# Patient Record
Sex: Male | Born: 2000 | Race: White | Hispanic: No | Marital: Single | Smoking: Former smoker
Health system: Southern US, Community
[De-identification: ages and names within clinical notes are randomized; demographics above are authoritative.]

## PROBLEM LIST (undated history)

## (undated) DIAGNOSIS — F429 Obsessive-compulsive disorder, unspecified: Secondary | ICD-10-CM

## (undated) DIAGNOSIS — R569 Unspecified convulsions: Secondary | ICD-10-CM

## (undated) DIAGNOSIS — F909 Attention-deficit hyperactivity disorder, unspecified type: Secondary | ICD-10-CM

## (undated) DIAGNOSIS — E669 Obesity, unspecified: Secondary | ICD-10-CM

## (undated) DIAGNOSIS — F913 Oppositional defiant disorder: Secondary | ICD-10-CM

## (undated) DIAGNOSIS — F419 Anxiety disorder, unspecified: Secondary | ICD-10-CM

## (undated) DIAGNOSIS — T7840XA Allergy, unspecified, initial encounter: Secondary | ICD-10-CM

## (undated) HISTORY — PX: HYPOSPADIAS CORRECTION: SHX483

---
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
## 2022-06-04 IMAGING — CT CT HEAD W/O CM
3 of 4 series · 13 of 47 positions shown, 15 images · non-contrast
Comparison: 01/05/2012

CLINICAL DATA: Headache.  Hit head on door 3 days ago.



[Series 3: head without · axial · non-contrast · 0.45mm/px · z∈[-128,+2]mm · 7 of 36 slices shown, 9 images]
[im 5/36  brain]
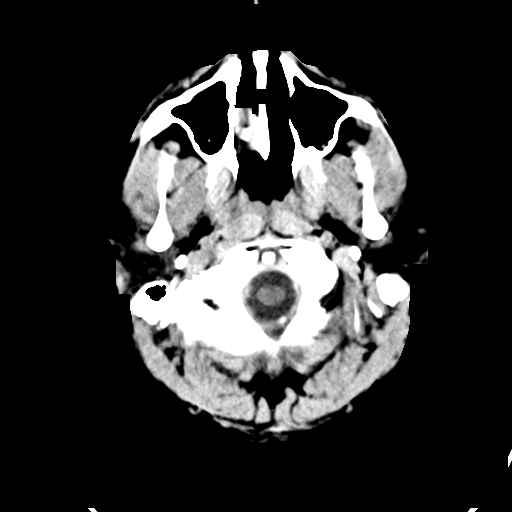
[im 5/36  bone]
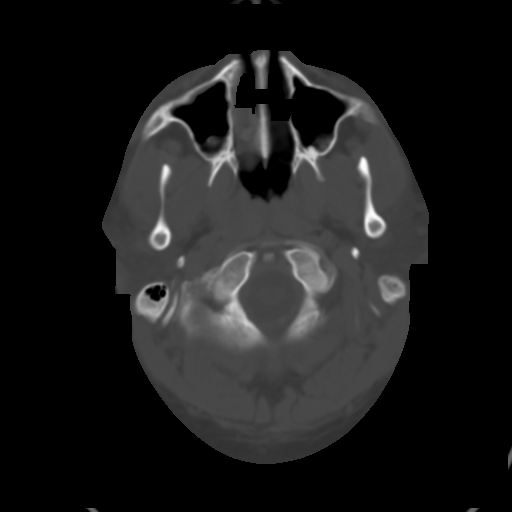
[im 9/36  brain]
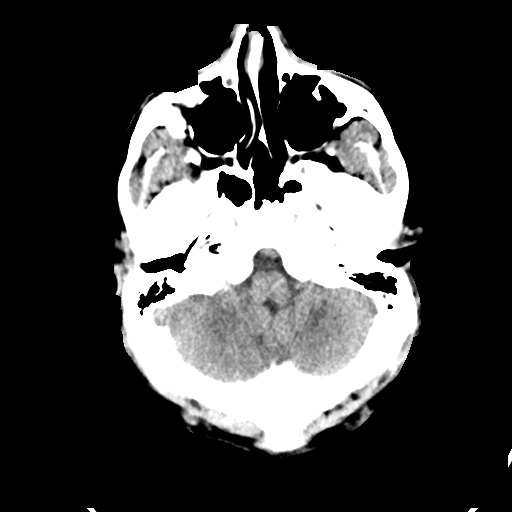
[im 14/36  brain]
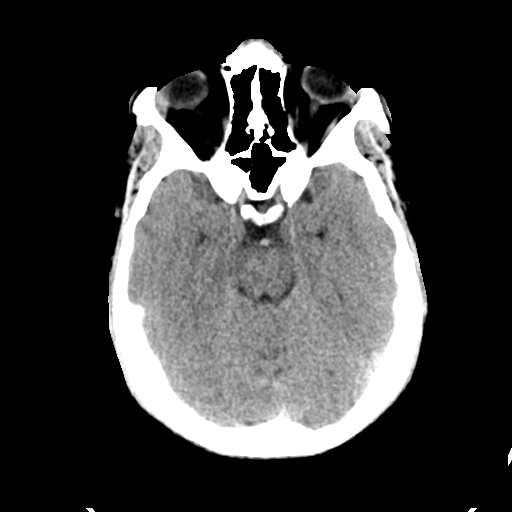
[im 18/36  brain]
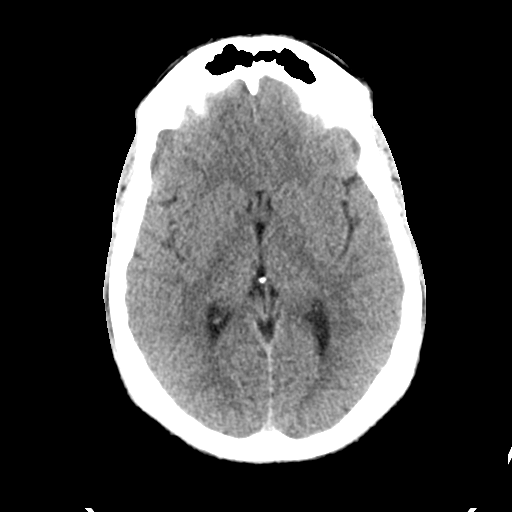
[im 22/36  brain]
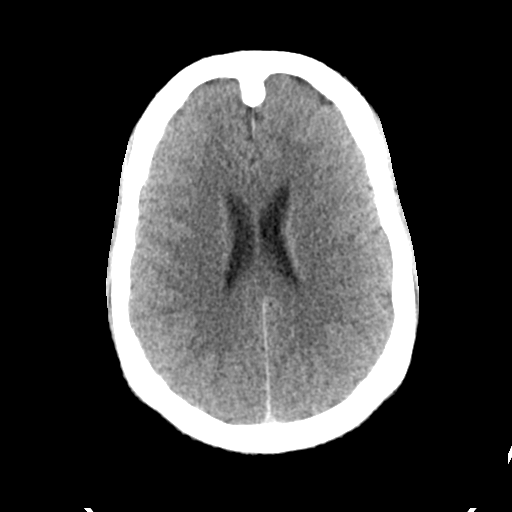
[im 22/36  bone]
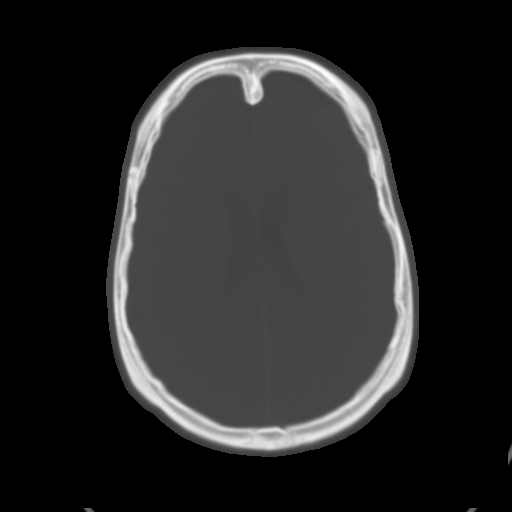
[im 27/36  brain]
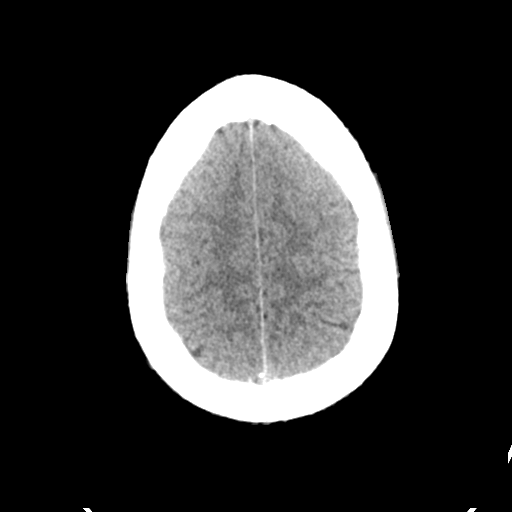
[im 31/36  brain]
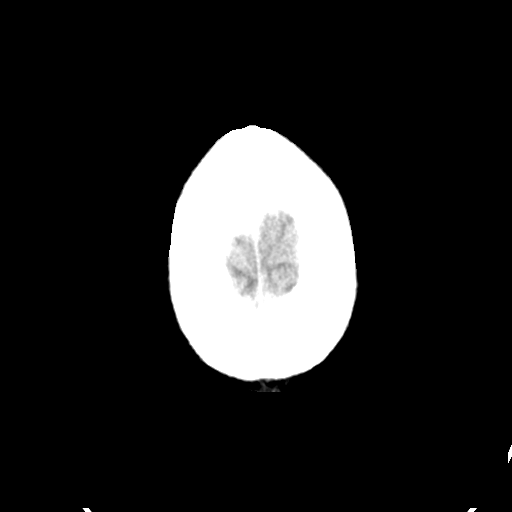

[Series 5: head without cor · coronal · non-contrast · 0.34mm/px · 3 of 77 slices shown]
[im 26/77  brain]
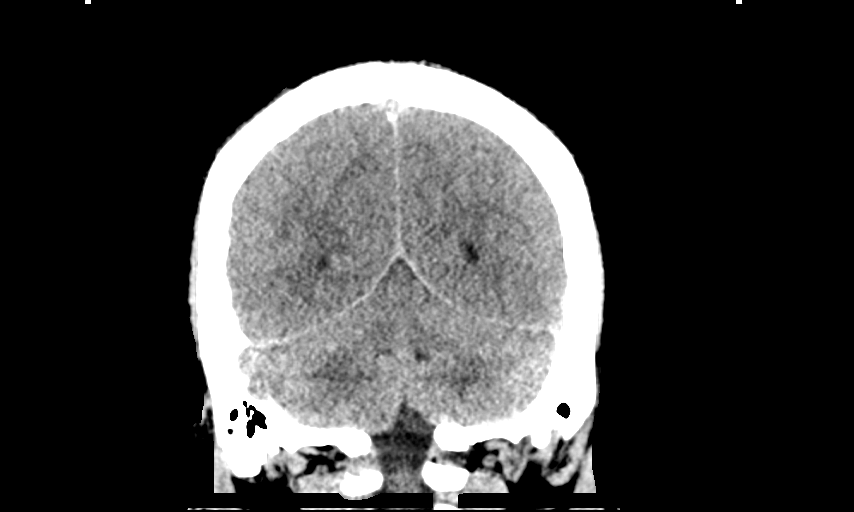
[im 34/77  brain]
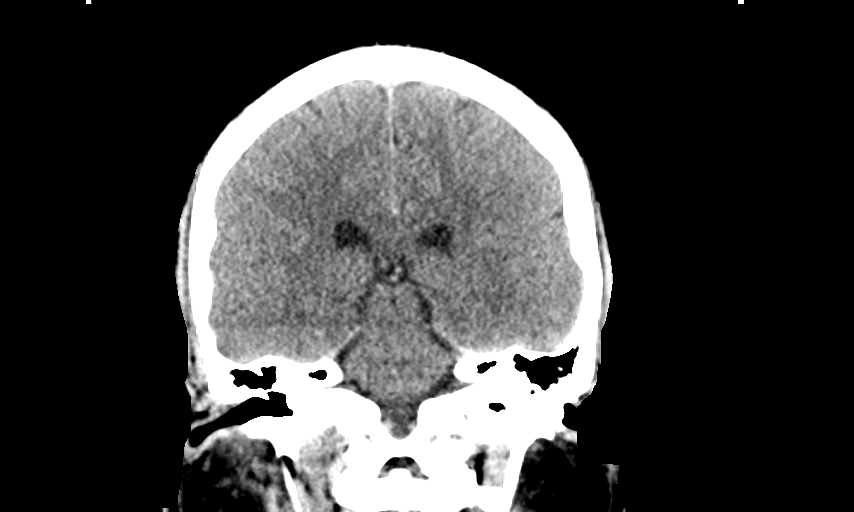
[im 43/77  brain]
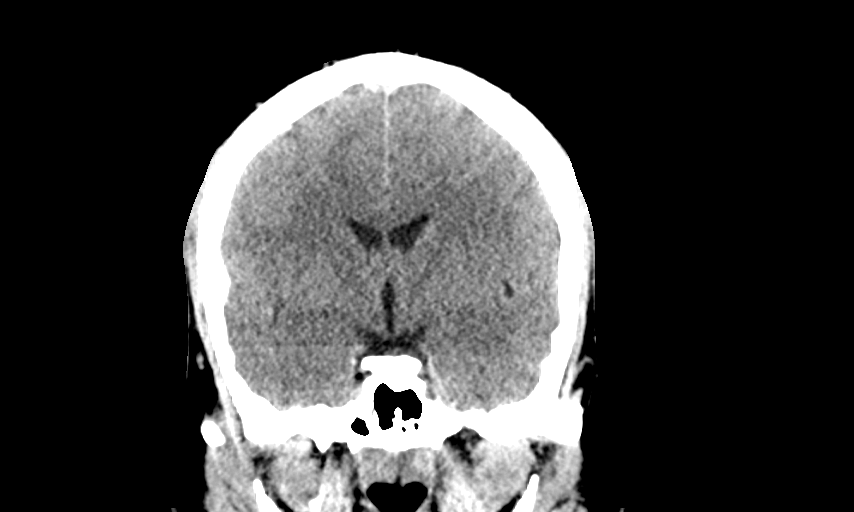

[Series 6: head without sag · sagittal · non-contrast · 0.34mm/px · 3 of 67 slices shown]
[im 23/67  brain]
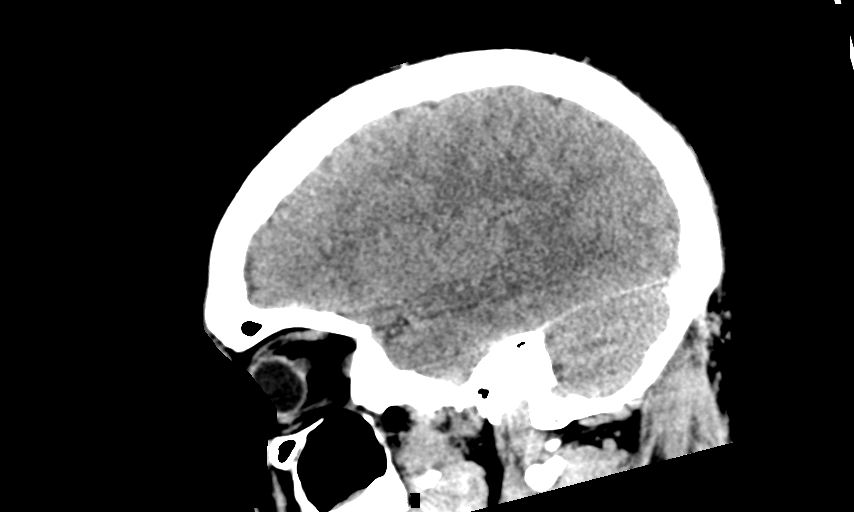
[im 34/67  brain]
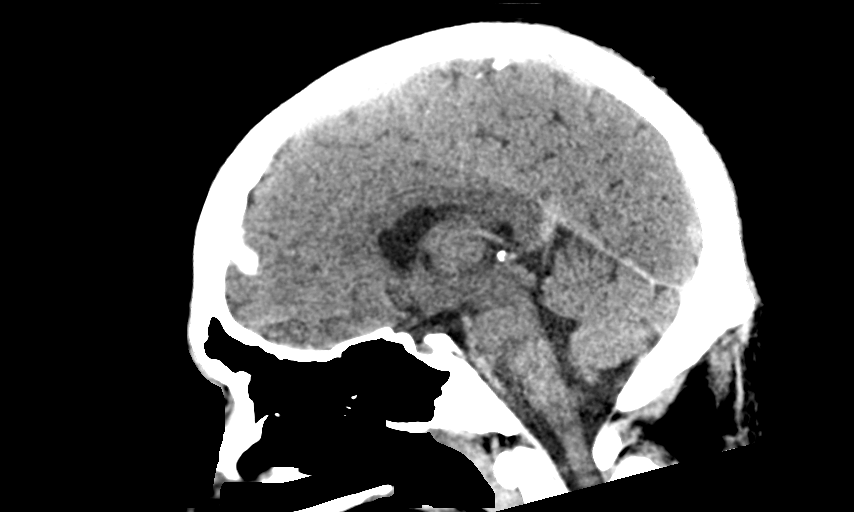
[im 45/67  brain]
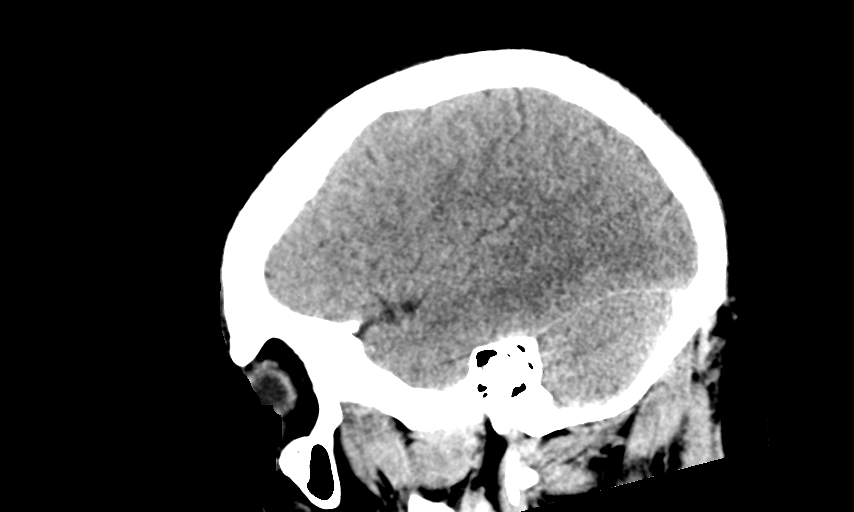

[13 of 47 positions shown; findings below may reference images not displayed]

FINDINGS: Brain: No acute intracranial abnormality. Specifically, no
hemorrhage, hydrocephalus, mass lesion, acute infarction, or
significant intracranial injury.

Vascular: No hyperdense vessel or unexpected calcification.

Skull: No acute calvarial abnormality.

Sinuses/Orbits: No acute findings

Other: None
IMPRESSION: Normal study.

## 2022-06-19 ENCOUNTER — Ambulatory Visit: Payer: Medicaid Other | Admitting: Family Medicine

## 2022-11-06 ENCOUNTER — Emergency Department (HOSPITAL_COMMUNITY)
Admission: EM | Admit: 2022-11-06 | Discharge: 2022-11-06 | Disposition: A | Discharge status: Home / Self Care | Payer: MEDICAID | Attending: Emergency Medicine | Admitting: Emergency Medicine

## 2022-11-06 ENCOUNTER — Emergency Department (HOSPITAL_COMMUNITY): Payer: MEDICAID

## 2022-11-06 ENCOUNTER — Encounter (HOSPITAL_COMMUNITY): Payer: Self-pay

## 2022-11-06 ENCOUNTER — Other Ambulatory Visit: Payer: Self-pay

## 2022-11-06 DIAGNOSIS — Z91040 Latex allergy status: Secondary | ICD-10-CM | POA: Diagnosis not present

## 2022-11-06 DIAGNOSIS — X509XXA Other and unspecified overexertion or strenuous movements or postures, initial encounter: Secondary | ICD-10-CM | POA: Diagnosis not present

## 2022-11-06 DIAGNOSIS — S93402A Sprain of unspecified ligament of left ankle, initial encounter: Secondary | ICD-10-CM | POA: Diagnosis not present

## 2022-11-06 DIAGNOSIS — M25572 Pain in left ankle and joints of left foot: Secondary | ICD-10-CM | POA: Diagnosis present

## 2022-11-06 MED ORDER — IBUPROFEN 400 MG PO TABS
600.0000 mg | ORAL_TABLET | Freq: Once | ORAL | Status: AC
  Administered 2022-11-06: 600 mg via ORAL
  Filled 2022-11-06: qty 1

## 2022-11-06 MED ORDER — ACETAMINOPHEN 500 MG PO TABS
1000.0000 mg | ORAL_TABLET | Freq: Once | ORAL | Status: AC
  Administered 2022-11-06: 1000 mg via ORAL
  Filled 2022-11-06: qty 2

## 2022-11-06 NOTE — Progress Notes (Signed)
Orthopedic Tech Progress Note Patient Details:  Douglas Sanchez June 11, 2000 [id number]  Ortho Devices Type of Ortho Device: Crutches Ortho Device/Splint Interventions: Ordered, Adjustment, Application  I taught the patient how to use the crutches then they got up and walked well. When I was trying to apply the brace they refused anything tight on their foot. I told the dr. Arlie Solomons Interventions Patient Tolerated: Well Instructions Provided: Care of device, Adjustment of device  Trinna Post 11/06/2022, 9:32 PM

## 2022-11-06 NOTE — ED Notes (Signed)
Ortho called for device placement and education. Patient provided education by provider for pain management.

## 2022-11-06 NOTE — Discharge Instructions (Signed)
Use crutches for support until you can bear weight without significant pain.  Elevate regularly with ice.  Use Tylenol 1000 mg and ibuprofen 600 mg together every 6 hours as needed for severe pain.

## 2022-11-06 NOTE — ED Provider Notes (Signed)
[facility] EMERGENCY DEPARTMENT AT [facility] Provider Note   CSN: [id number] Arrival date & time: 11/06/22  1517     History  Chief Complaint  Patient presents with   Ankle Pain    Douglas Sanchez is a 22 y.o. male.  Patient presents with persistent left ankle pain and swelling but 2 days ago.  During sleep he felt a pop.  No direct trauma noted.  No other joint swelling.  No fevers or chills.  Pain worse with walking.  No history of gout.       Home Medications Prior to Admission medications   Medication Sig Start Date End Date Taking? Authorizing Provider  atomoxetine (STRATTERA) 60 MG capsule Take 1 capsule (60 mg total) by mouth daily. 12/13/16   Denzil Magnuson, NP  divalproex (DEPAKOTE ER) 250 MG 24 hr tablet Take 1 tablet (250 mg total) by mouth daily after breakfast. Patient taking differently: Take 250 mg by mouth 2 (two) times daily. 12/13/16   Denzil Magnuson, NP  divalproex (DEPAKOTE ER) 500 MG 24 hr tablet Take 500 mg by mouth 2 (two) times daily. 09/10/19   [provider]  guanFACINE (INTUNIV) 2 MG TB24 ER tablet Take 1 tablet (2 mg total) by mouth at bedtime. Patient taking differently: Take 2 mg by mouth daily. 12/12/16   Denzil Magnuson, NP  ibuprofen (ADVIL) 200 MG tablet Take 1 tablet (200 mg total) by mouth every 6 (six) hours as needed. 01/23/20   Fabio Bering, MD  lisinopril (ZESTRIL) 5 MG tablet Take 1 tablet (5 mg total) by mouth daily. 09/24/19 01/23/20  Stryffeler, Jonathon Jordan, NP  loratadine (CLARITIN) 10 MG tablet Take 1 tablet (10 mg total) by mouth daily. 09/05/19 01/23/20  Stryffeler, Jonathon Jordan, NP  oseltamivir (TAMIFLU) 75 MG capsule Take 1 capsule (75 mg total) by mouth every 12 (twelve) hours. 02/09/21   Meccariello, Solmon Ice, MD  ziprasidone (GEODON) 80 MG capsule Take 80 mg by mouth at bedtime.    [provider]      Allergies    Latex    Review of Systems   Review of Systems   Constitutional:  Negative for chills and fever.  HENT:  Negative for congestion.   Eyes:  Negative for visual disturbance.  Respiratory:  Negative for shortness of breath.   Cardiovascular:  Negative for chest pain.  Gastrointestinal:  Negative for abdominal pain and vomiting.  Genitourinary:  Negative for dysuria and flank pain.  Musculoskeletal:  Positive for gait problem and joint swelling. Negative for back pain, neck pain and neck stiffness.  Skin:  Negative for rash.  Neurological:  Negative for light-headedness and headaches.    Physical Exam Updated Vital Signs BP 138/84 (BP Location: Left Arm)   Pulse 92   Temp 97.9 F (36.6 C) (Oral)   Resp 18   Ht 5\' 8"  (1.727 m)   Wt 117.9 kg   SpO2 100%   BMI 39.53 kg/m  Physical Exam Vitals and nursing note reviewed.  Constitutional:      General: He is not in acute distress.    Appearance: He is well-developed.  HENT:     Head: Normocephalic and atraumatic.     Mouth/Throat:     Mouth: Mucous membranes are moist.  Eyes:     General:        Right eye: No discharge.        Left eye: No discharge.     Conjunctiva/sclera: Conjunctivae normal.  Neck:  Trachea: No tracheal deviation.  Cardiovascular:     Rate and Rhythm: Normal rate.  Pulmonary:     Effort: Pulmonary effort is normal.  Abdominal:     General: There is no distension.     Palpations: Abdomen is soft.     Tenderness: There is no abdominal tenderness. There is no guarding.  Musculoskeletal:        General: Swelling and tenderness present. No deformity.     Cervical back: Normal range of motion and neck supple. No rigidity.     Comments: Patient has tenderness and moderate swelling left ankle and lateral malleolus.  No signs of infection externally.  Decreased range of motion due to pain.  Compartments soft and no proximal tibia-fibula tenderness.  No distal foot or midfoot tenderness.  Neurovascular intact.  Skin:    General: Skin is warm.     Capillary  Refill: Capillary refill takes less than 2 seconds.     Findings: No rash.  Neurological:     General: No focal deficit present.     Mental Status: He is alert.  Psychiatric:        Mood and Affect: Mood normal.     ED Results / Procedures / Treatments   Labs (all labs ordered are listed, but only abnormal results are displayed) Labs Reviewed - No data to display  EKG None  Radiology DG Ankle Complete Left  Result Date: 11/06/2022 CLINICAL DATA:  pain EXAM: LEFT ANKLE COMPLETE - 3+ VIEW COMPARISON:  None Available. FINDINGS: Normal alignment. No acute fracture. Normal mineralization. Bimalleolar soft tissue swelling slightly more pronounced along the medial malleolus. IMPRESSION: Soft tissue swelling of the ankle without underlying acute fracture. Electronically Signed   By: Olive Bass M.D.   On: 11/06/2022 16:25    Procedures Procedures    Medications Ordered in ED Medications  acetaminophen (TYLENOL) tablet 1,000 mg (1,000 mg Oral Given 11/06/22 2100)  ibuprofen (ADVIL) tablet 600 mg (600 mg Oral Given 11/06/22 2100)    ED Course/ Medical Decision Making/ A&P                                 Medical Decision Making Amount and/or Complexity of Data Reviewed Radiology: ordered.  Risk OTC drugs.   Patient presents with left ankle pain and swelling clinical concern for ligamentous strain.  No infectious symptoms.  Other differential include gout/equivalent.  Discussed Ace wrap or ASO, patient did not tolerate ASO by technician.  Crutches given by technician.  Ibuprofen/Tylenol ordered.  Discussed follow-up with orthopedics as needed mother and patient comfortable plan.        Final Clinical Impression(s) / ED Diagnoses Final diagnoses:  Moderate left ankle sprain, initial encounter    Rx / DC Orders ED Discharge Orders     None         Blane Ohara, MD 11/06/22 2314

## 2022-11-06 NOTE — ED Triage Notes (Signed)
Pt came in via POV d/t Lt ankle pain that started 2 days ago & is unaware how it happened. States that it is a constant pain, is swollen & hard to walk. A/Ox4, rates pain 8/10.Denies any recent falls/injuries.

## 2022-11-09 ENCOUNTER — Other Ambulatory Visit: Payer: Self-pay

## 2022-11-09 ENCOUNTER — Encounter (HOSPITAL_COMMUNITY): Payer: Self-pay

## 2022-11-09 ENCOUNTER — Emergency Department (HOSPITAL_COMMUNITY)
Admission: EM | Admit: 2022-11-09 | Discharge: 2022-11-13 | Disposition: A | Discharge status: Other Acute Inpatient Hospital | Payer: MEDICAID | Attending: Emergency Medicine | Admitting: Emergency Medicine

## 2022-11-09 DIAGNOSIS — F29 Unspecified psychosis not due to a substance or known physiological condition: Secondary | ICD-10-CM | POA: Insufficient documentation

## 2022-11-09 DIAGNOSIS — X838XXA Intentional self-harm by other specified means, initial encounter: Secondary | ICD-10-CM | POA: Insufficient documentation

## 2022-11-09 DIAGNOSIS — S0101XA Laceration without foreign body of scalp, initial encounter: Secondary | ICD-10-CM | POA: Diagnosis not present

## 2022-11-09 DIAGNOSIS — Z79899 Other long term (current) drug therapy: Secondary | ICD-10-CM | POA: Insufficient documentation

## 2022-11-09 DIAGNOSIS — S0990XA Unspecified injury of head, initial encounter: Secondary | ICD-10-CM | POA: Diagnosis present

## 2022-11-09 DIAGNOSIS — R4689 Other symptoms and signs involving appearance and behavior: Secondary | ICD-10-CM

## 2022-11-09 DIAGNOSIS — F913 Oppositional defiant disorder: Secondary | ICD-10-CM | POA: Diagnosis not present

## 2022-11-09 DIAGNOSIS — F4310 Post-traumatic stress disorder, unspecified: Secondary | ICD-10-CM | POA: Insufficient documentation

## 2022-11-09 DIAGNOSIS — R4589 Other symptoms and signs involving emotional state: Secondary | ICD-10-CM

## 2022-11-09 DIAGNOSIS — F319 Bipolar disorder, unspecified: Secondary | ICD-10-CM | POA: Diagnosis not present

## 2022-11-09 LAB — CBC WITH DIFFERENTIAL/PLATELET
Abs Immature Granulocytes: 0.08 10*3/uL — ABNORMAL HIGH (ref 0.00–0.07)
Basophils Absolute: 0.1 10*3/uL (ref 0.0–0.1)
Basophils Relative: 1 %
Eosinophils Absolute: 0.1 10*3/uL (ref 0.0–0.5)
Eosinophils Relative: 1 %
HCT: 43.9 % (ref 39.0–52.0)
Hemoglobin: 15.6 g/dL (ref 13.0–17.0)
Immature Granulocytes: 1 %
Lymphocytes Relative: 10 %
Lymphs Abs: 1.4 10*3/uL (ref 0.7–4.0)
MCH: 29.9 pg (ref 26.0–34.0)
MCHC: 35.5 g/dL (ref 30.0–36.0)
MCV: 84.1 fL (ref 80.0–100.0)
Monocytes Absolute: 0.9 10*3/uL (ref 0.1–1.0)
Monocytes Relative: 7 %
Neutro Abs: 11 10*3/uL — ABNORMAL HIGH (ref 1.7–7.7)
Neutrophils Relative %: 80 %
Platelets: 385 10*3/uL (ref 150–400)
RBC: 5.22 MIL/uL (ref 4.22–5.81)
RDW: 12.3 % (ref 11.5–15.5)
WBC: 13.6 10*3/uL — ABNORMAL HIGH (ref 4.0–10.5)
nRBC: 0 % (ref 0.0–0.2)

## 2022-11-09 LAB — COMPREHENSIVE METABOLIC PANEL
ALT: 46 U/L — ABNORMAL HIGH (ref 0–44)
AST: 33 U/L (ref 15–41)
Albumin: 4.7 g/dL (ref 3.5–5.0)
Alkaline Phosphatase: 44 U/L (ref 38–126)
Anion gap: 12 (ref 5–15)
BUN: 9 mg/dL (ref 6–20)
CO2: 24 mmol/L (ref 22–32)
Calcium: 9.9 mg/dL (ref 8.9–10.3)
Chloride: 102 mmol/L (ref 98–111)
Creatinine, Ser: 0.93 mg/dL (ref 0.61–1.24)
GFR, Estimated: >60 mL/min (ref >60)
Glucose, Bld: 111 mg/dL — ABNORMAL HIGH (ref 70–99)
Potassium: 3.5 mmol/L (ref 3.5–5.1)
Sodium: 138 mmol/L (ref 135–145)
Total Bilirubin: 1.2 mg/dL (ref 0.3–1.2)
Total Protein: 8.5 g/dL — ABNORMAL HIGH (ref 6.5–8.1)

## 2022-11-09 LAB — SALICYLATE LEVEL: Salicylate Lvl: <7 mg/dL — ABNORMAL LOW (ref 7.0–30.0)

## 2022-11-09 LAB — ETHANOL: Alcohol, Ethyl (B): <10 mg/dL (ref <10)

## 2022-11-09 LAB — ACETAMINOPHEN LEVEL: Acetaminophen (Tylenol), Serum: <10 ug/mL — ABNORMAL LOW (ref 10–30)

## 2022-11-09 NOTE — ED Notes (Signed)
Pt placed in maroon scrubs, wanded by security, belonging placed in locker 4 in purple zone.

## 2022-11-09 NOTE — ED Notes (Signed)
[employer] placed pt under IVC
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
## 2022-11-10 DIAGNOSIS — R4589 Other symptoms and signs involving emotional state: Secondary | ICD-10-CM

## 2022-11-10 LAB — RAPID URINE DRUG SCREEN, HOSP PERFORMED
Amphetamines: NOT DETECTED
Barbiturates: NOT DETECTED
Benzodiazepines: NOT DETECTED
Cocaine: NOT DETECTED
Opiates: NOT DETECTED
Tetrahydrocannabinol: POSITIVE — AB

## 2022-11-10 MED ORDER — ZIPRASIDONE MESYLATE 20 MG IM SOLR: 20.0000 mg | INTRAMUSCULAR | Status: DC | PRN

## 2022-11-10 MED ORDER — ARIPIPRAZOLE 5 MG PO TABS
5.0000 mg | ORAL_TABLET | Freq: Every day | ORAL | Status: DC
  Administered 2022-11-10 – 2022-11-13 (×4): 5 mg via ORAL
  Filled 2022-11-10 (×4): qty 1

## 2022-11-10 MED ORDER — LORAZEPAM 1 MG PO TABS
1.0000 mg | ORAL_TABLET | ORAL | Status: AC | PRN
  Administered 2022-11-12: 1 mg via ORAL
  Filled 2022-11-10: qty 1

## 2022-11-10 MED ORDER — TRAZODONE HCL 100 MG PO TABS
100.0000 mg | ORAL_TABLET | Freq: Every day | ORAL | Status: DC
  Administered 2022-11-11 – 2022-11-12 (×2): 100 mg via ORAL
  Filled 2022-11-10 (×3): qty 1

## 2022-11-10 MED ORDER — OLANZAPINE 5 MG PO TBDP
5.0000 mg | ORAL_TABLET | Freq: Three times a day (TID) | ORAL | Status: DC | PRN
  Administered 2022-11-10 – 2022-11-12 (×3): 5 mg via ORAL
  Filled 2022-11-10 (×3): qty 1
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
## 2022-11-10 NOTE — ED Provider Notes (Signed)
Emergency Medicine Observation Re-evaluation Note  Douglas Sanchez is a 22 y.o. male, seen on rounds today.  Pt initially presented to the ED for complaints of Psychiatric Evaluation Currently, the patient is cooperative.  Physical Exam  BP (!) 144/105   Pulse 93   Temp 98.3 F (36.8 C) (Oral)   Resp 17   Ht 5\' 8"  (1.727 m)   Wt 117.9 kg   SpO2 100%   BMI 39.53 kg/m  Physical Exam Vitals reviewed.  Constitutional:      Appearance: Normal appearance.  HENT:     Head: Normocephalic.  Cardiovascular:     Rate and Rhythm: Normal rate.  Pulmonary:     Effort: Pulmonary effort is normal.  Neurological:     Mental Status: He is alert.  Psychiatric:        Mood and Affect: Mood normal.      ED Course / MDM  EKG:   I have reviewed the labs performed to date as well as medications administered while in observation.  Recent changes in the last 24 hours include having his wound sutured.  Plan  Current plan is for inpatient psychiatric admission. Pending bed.    Anders Simmonds T, DO 11/10/22 1452
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
## 2022-11-10 NOTE — ED Notes (Signed)
IVC paper are now into purple zone
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
## 2022-11-11 MED ORDER — ONDANSETRON 4 MG PO TBDP
4.0000 mg | ORAL_TABLET | Freq: Once | ORAL | Status: AC
  Administered 2022-11-11: 4 mg via ORAL
  Filled 2022-11-11: qty 1

## 2022-11-11 MED ORDER — IBUPROFEN 400 MG PO TABS
400.0000 mg | ORAL_TABLET | Freq: Once | ORAL | Status: AC | PRN
  Administered 2022-11-11: 400 mg via ORAL
  Filled 2022-11-11: qty 1

## 2022-11-11 NOTE — ED Notes (Signed)
Lunch provided.

## 2022-11-11 NOTE — ED Notes (Signed)
Voiced to pt reason for IVC. Pt aware that once a facility has a bed available, pt will be transferred to said place. Pt states acetaminophen and ibuprofen does not help with pain. Voiced to pt that message will be sent to doctor. Pt refused prn med for agitation at this time. Pt remains right lateral position in bed with eyes open, respirations even, unlabored. Sitter in view of the pt at this time.

## 2022-11-11 NOTE — ED Notes (Signed)
Pt noted vomiting onto floor. Pt denies pain/discomfort at this time. Message sent to Dr Durwin Nora.

## 2022-11-11 NOTE — ED Notes (Signed)
Mary(mother) updated of pt's current ED status.

## 2022-11-11 NOTE — ED Notes (Signed)
ED Provider at bedside. 

## 2022-11-11 NOTE — Progress Notes (Signed)
Pt remains under review at [facility]. 1st shift CSW to follow up.   Maryjean Ka, MSW, LCSWA 11/11/2022 2:00 AM

## 2022-11-11 NOTE — Progress Notes (Signed)
Patient has been denied by [facility] due to no appropriate beds available. Patient meets [facility] inpatient criteria per Rockney Ghee, NP. Patient has been faxed out to the following facilities:   [facility]  [address] [place] [place] [address] [phone]  [facility]  [address] [place] [address] [phone]  [facility]  [address] [place] [address] [phone]  [facility]  [address] [place] [address] [phone]  [facility]  [address] [place] [address] [phone]  [facility]  [address] [place] [address] [phone]  [facility]  [address] [place] [address] [phone]  [facility]  [address] [place], [place] [address] [phone]  [facility]  [address] [place], [place] [address] [phone]  [facility]  [address] [place] [address] [phone]  [facility]  [address], [place] [address] [phone]  [facility]  [address] [place] [address] [phone]   Damita Dunnings, MSW, LCSW-A  12:09 PM 11/11/2022

## 2022-11-11 NOTE — ED Notes (Signed)
Patient states he feels like he is having a panic attack or anxiety; PRN med given-Monique,RN

## 2022-11-11 NOTE — ED Notes (Signed)
Patient using phone at this time calling his mother.

## 2022-11-11 NOTE — ED Provider Notes (Signed)
Emergency Medicine Observation Re-evaluation Note  Douglas Sanchez is a 22 y.o. male, seen on rounds today.  Pt initially presented to the ED for complaints of Psychiatric Evaluation Currently, the patient is sleeping.  Physical Exam  BP (!) 141/81   Pulse 80   Temp 98 F (36.7 C) (Oral)   Resp 18   Ht 5\' 8"  (1.727 m)   Wt 117.9 kg   SpO2 100%   BMI 39.53 kg/m  Physical Exam General: Sleeping Cardiac: Extremities perfused Lungs: Breathing is unlabored Psych: Deferred  ED Course / MDM  EKG:EKG Interpretation Date/Time:  Friday November 10 2022 16:27:05 EDT Ventricular Rate:  63 PR Interval:  118 QRS Duration:  108 QT Interval:  389 QTC Calculation: 399 R Axis:   61  Text Interpretation: Sinus rhythm Borderline short PR interval Borderline Q waves in inferior leads No significant change since last tracing Confirmed by Elayne Snare ([id number]) on 11/10/2022 8:06:26 PM  I have reviewed the labs performed to date as well as medications administered while in observation.  Recent changes in the last 24 hours include none.  Plan  Current plan is for inpatient psychiatric admission.    Gloris Manchester, MD 11/11/22 1113

## 2022-11-12 DIAGNOSIS — R4589 Other symptoms and signs involving emotional state: Secondary | ICD-10-CM
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
## 2022-11-12 NOTE — Progress Notes (Signed)
Patient has been denied by [facility] due to no appropriate beds available. Patient meets [facility] inpatient criteria per Osborne Oman, NP. Patient has been faxed out to the following facilities:   [facility]  [address] [place] [address] [phone]  [facility]  [address] [place] [address] [phone]  [facility]  [address] [place] [address] [phone]  [facility]  [address] [place] [address] [phone]  [facility]  [address] [place] [address] [phone]  [facility]  [address], [place] [address] [phone]  [facility]  [address] [place] [address] [phone]  [facility]  [address] [place], [place] [address] [phone]  [facility]  [address] [place], [place] [address] [phone]  [facility]  [address] [place] [address] [phone]  [facility]  [address] [place] [address] [phone]  [facility]  [address], [place] [address] [phone]   Damita Dunnings, MSW, LCSW-A  1:58 PM 11/12/2022

## 2022-11-12 NOTE — ED Notes (Signed)
Patient on the phone yelling and cussing at his mother, asked him to hang up and he is refusing, still currently cussing even though [facility] PD and security are at the desk.
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
## 2022-11-13 ENCOUNTER — Encounter (HOSPITAL_COMMUNITY): Payer: Self-pay | Admitting: Psychiatry

## 2022-11-13 ENCOUNTER — Other Ambulatory Visit: Payer: Self-pay

## 2022-11-13 ENCOUNTER — Inpatient Hospital Stay (HOSPITAL_COMMUNITY)
Admission: AD | Admit: 2022-11-13 | Discharge: 2022-11-19 | DRG: 885 | Disposition: A | Discharge status: Home / Self Care | Payer: MEDICAID | Source: Intra-hospital | Attending: Psychiatry | Admitting: Psychiatry

## 2022-11-13 DIAGNOSIS — F410 Panic disorder [episodic paroxysmal anxiety] without agoraphobia: Secondary | ICD-10-CM | POA: Diagnosis present

## 2022-11-13 DIAGNOSIS — F909 Attention-deficit hyperactivity disorder, unspecified type: Secondary | ICD-10-CM | POA: Diagnosis present

## 2022-11-13 DIAGNOSIS — S0101XA Laceration without foreign body of scalp, initial encounter: Secondary | ICD-10-CM | POA: Diagnosis not present

## 2022-11-13 DIAGNOSIS — Z818 Family history of other mental and behavioral disorders: Secondary | ICD-10-CM

## 2022-11-13 DIAGNOSIS — S61519D Laceration without foreign body of unspecified wrist, subsequent encounter: Secondary | ICD-10-CM

## 2022-11-13 DIAGNOSIS — R45850 Homicidal ideations: Secondary | ICD-10-CM | POA: Diagnosis present

## 2022-11-13 DIAGNOSIS — F23 Brief psychotic disorder: Secondary | ICD-10-CM | POA: Diagnosis present

## 2022-11-13 DIAGNOSIS — F341 Dysthymic disorder: Secondary | ICD-10-CM | POA: Diagnosis present

## 2022-11-13 DIAGNOSIS — I10 Essential (primary) hypertension: Secondary | ICD-10-CM | POA: Diagnosis present

## 2022-11-13 DIAGNOSIS — G40909 Epilepsy, unspecified, not intractable, without status epilepticus: Secondary | ICD-10-CM | POA: Diagnosis present

## 2022-11-13 DIAGNOSIS — F4310 Post-traumatic stress disorder, unspecified: Secondary | ICD-10-CM | POA: Diagnosis present

## 2022-11-13 DIAGNOSIS — Z79899 Other long term (current) drug therapy: Secondary | ICD-10-CM

## 2022-11-13 DIAGNOSIS — K219 Gastro-esophageal reflux disease without esophagitis: Secondary | ICD-10-CM | POA: Diagnosis present

## 2022-11-13 DIAGNOSIS — F429 Obsessive-compulsive disorder, unspecified: Secondary | ICD-10-CM | POA: Diagnosis present

## 2022-11-13 DIAGNOSIS — Z5900 Homelessness unspecified: Secondary | ICD-10-CM

## 2022-11-13 DIAGNOSIS — F913 Oppositional defiant disorder: Secondary | ICD-10-CM | POA: Diagnosis present

## 2022-11-13 MED ORDER — MAGNESIUM HYDROXIDE 400 MG/5ML PO SUSP: 30.0000 mL | Freq: Every day | ORAL | Status: DC | PRN

## 2022-11-13 MED ORDER — ZIPRASIDONE MESYLATE 20 MG IM SOLR: 20.0000 mg | INTRAMUSCULAR | Status: DC | PRN

## 2022-11-13 MED ORDER — TRAZODONE HCL 100 MG PO TABS
100.0000 mg | ORAL_TABLET | Freq: Every day | ORAL | Status: DC
  Administered 2022-11-13 – 2022-11-14 (×2): 100 mg via ORAL
  Filled 2022-11-13 (×5): qty 1

## 2022-11-13 MED ORDER — ALUM & MAG HYDROXIDE-SIMETH 200-200-20 MG/5ML PO SUSP: 30.0000 mL | ORAL | Status: DC | PRN

## 2022-11-13 MED ORDER — OLANZAPINE 5 MG PO TBDP
5.0000 mg | ORAL_TABLET | Freq: Three times a day (TID) | ORAL | Status: DC | PRN
  Administered 2022-11-18: 5 mg via ORAL
  Filled 2022-11-13 (×2): qty 1

## 2022-11-13 MED ORDER — ARIPIPRAZOLE 5 MG PO TABS
5.0000 mg | ORAL_TABLET | Freq: Every day | ORAL | Status: DC
  Administered 2022-11-14 – 2022-11-15 (×2): 5 mg via ORAL
  Filled 2022-11-13 (×5): qty 1

## 2022-11-13 MED ORDER — ACETAMINOPHEN 325 MG PO TABS
650.0000 mg | ORAL_TABLET | Freq: Four times a day (QID) | ORAL | Status: DC | PRN
  Administered 2022-11-13 – 2022-11-16 (×3): 650 mg via ORAL
  Filled 2022-11-13 (×3): qty 2
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
## 2022-11-13 NOTE — Group Note (Signed)
Date:  11/13/2022 Time:  6:10 PM  Group Topic/Focus:  Goals Group:   The focus of this group is to help patients establish daily goals to achieve during treatment and discuss how the patient can incorporate goal setting into their daily lives to aide in recovery.    Participation Level:  Did Not Attend    Donell Beers 11/13/2022, 6:10 PM

## 2022-11-13 NOTE — Tx Team (Signed)
Initial Treatment Plan 11/13/2022 4:41 PM Douglas Sanchez AOZ:[id number]    PATIENT STRESSORS: Marital or family conflict     PATIENT STRENGTHS: General fund of knowledge  Supportive family/friends    PATIENT IDENTIFIED PROBLEMS: Anxiety  Depression                    DISCHARGE CRITERIA:  Ability to meet basic life and health needs Adequate post-discharge living arrangements Improved stabilization in mood, thinking, and/or behavior  PRELIMINARY DISCHARGE PLAN: Outpatient therapy  PATIENT/FAMILY INVOLVEMENT: This treatment plan has been presented to and reviewed with the patient, Douglas Sanchez, and/or family member, .  The patient and family have been given the opportunity to ask questions and make suggestions.  Virgel Paling, RN 11/13/2022, 4:41 PM

## 2022-11-13 NOTE — ED Notes (Signed)
Douglas Sanchez  (Mother) was given patient clothes, one pair of shoes, tablet  and charger.

## 2022-11-13 NOTE — Progress Notes (Signed)
Pt has been accepted to [facility] TODAY 11/13/2022. Bed assignment: 400-1  Pt meets inpatient criteria per Caryn Bee, NP  Attending Physician will be Nadir Abbott Pao, MD  Report can be called to: - Adult unit: [phone]  Pt can arrive anytime; bed is ready now  Care Team Notified: [facility] Malva Limes, RN, Caryn Bee, NP, Florentina Addison, RN, and [facility], RN  [place], [place]  11/13/2022 2:27 PM

## 2022-11-13 NOTE — ED Provider Notes (Signed)
Emergency Medicine Observation Re-evaluation Note  Douglas Sanchez is a 22 y.o. male, seen on rounds today.  Pt initially presented to the ED for complaints of Psychiatric Evaluation Currently, the patient is awaiting placement.  Physical Exam  BP 117/72 (BP Location: Left Arm)   Pulse 81   Temp 98.4 F (36.9 C) (Oral)   Resp 18   Ht 5\' 8"  (1.727 m)   Wt 117.9 kg   SpO2 99%   BMI 39.53 kg/m  Physical Exam General: Calm Cardiac: Well perfused Lungs: Even respirations Psych: Calm  ED Course / MDM  EKG:EKG Interpretation Date/Time:  Friday November 10 2022 16:27:05 EDT Ventricular Rate:  63 PR Interval:  118 QRS Duration:  108 QT Interval:  389 QTC Calculation: 399 R Axis:   61  Text Interpretation: Sinus rhythm Borderline short PR interval Borderline Q waves in inferior leads No significant change since last tracing Confirmed by Elayne Snare ([id number]) on 11/10/2022 8:06:26 PM  I have reviewed the labs performed to date as well as medications administered while in observation.  Recent changes in the last 24 hours include patient accepted to [facility].  Plan  Current plan is for [facility] admit. Dr. Abbott Pao is the accepting.     Maia Plan, MD 11/14/22 1520

## 2022-11-13 NOTE — Plan of Care (Signed)

## 2022-11-13 NOTE — Progress Notes (Signed)
The client has been accepted to [facility] and the family had question.  This PMHNP met with them and answered visitation type questions among others.  Nanine Means, PMHNP

## 2022-11-13 NOTE — ED Notes (Signed)
Patient will take crutches to [facility]

## 2022-11-14 MED ORDER — FLUOXETINE HCL 10 MG PO CAPS
30.0000 mg | ORAL_CAPSULE | Freq: Every day | ORAL | Status: DC
  Administered 2022-11-14 – 2022-11-19 (×6): 30 mg via ORAL
  Filled 2022-11-14 (×9): qty 3

## 2022-11-14 MED ORDER — SODIUM CHLORIDE 0.9 % IV SOLN: 10.0000 mg | Freq: Two times a day (BID) | INTRAVENOUS | Status: DC

## 2022-11-14 NOTE — Progress Notes (Signed)
   11/14/22 2125  Psych Admission Type (Psych Patients Only)  Admission Status Involuntary  Psychosocial Assessment  Patient Complaints Anxiety;Depression  Eye Contact Brief  Facial Expression Anxious  Affect Anxious;Depressed  Speech Logical/coherent  Interaction Cautious  Motor Activity Fidgety  Appearance/Hygiene Disheveled  Behavior Characteristics Cooperative;Anxious  Mood Depressed;Anxious  Thought Process  Coherency WDL  Content Blaming others  Delusions None reported or observed  Perception WDL  Hallucination None reported or observed  Judgment Limited  Confusion None  Danger to Self  Current suicidal ideation? Denies  Agreement Not to Harm Self Yes  Description of Agreement Verbal  Danger to Others  Danger to Others None reported or observed

## 2022-11-14 NOTE — Plan of Care (Signed)
  Problem: Education: Goal: Knowledge of [facility] Education information/materials will improve Outcome: Progressing Goal: Emotional status will improve Outcome: Progressing Goal: Mental status will improve Outcome: Progressing Goal: Verbalization of understanding the information provided will improve Outcome: Progressing   Problem: Activity: Goal: Interest or engagement in activities will improve Outcome: Progressing   Problem: Coping: Goal: Ability to verbalize frustrations and anger appropriately will improve Outcome: Progressing Goal: Ability to demonstrate self-control will improve Outcome: Progressing   Problem: Health Behavior/Discharge Planning: Goal: Compliance with treatment plan for underlying cause of condition will improve Outcome: Progressing   Problem: Physical Regulation: Goal: Ability to maintain clinical measurements within normal limits will improve Outcome: Progressing

## 2022-11-14 NOTE — Group Note (Signed)
[facility] LCSW Group Therapy Note   Group Date: 11/14/2022 Start Time: 1100 End Time: 1140   Type of Therapy/Topic:  Group Therapy:  Emotion Regulation  Participation Level:  Did Not Attend   Mood:  Description of Group:    The purpose of this group is to assist patients in learning to regulate negative emotions and experience positive emotions. Patients will be guided to discuss ways in which they have been vulnerable to their negative emotions. These vulnerabilities will be juxtaposed with experiences of positive emotions or situations, and patients challenged to use positive emotions to combat negative ones. Special emphasis will be placed on coping with negative emotions in conflict situations, and patients will process healthy conflict resolution skills.  Therapeutic Goals: Patient will identify two positive emotions or experiences to reflect on in order to balance out negative emotions:  Patient will label two or more emotions that they find the most difficult to experience:  Patient will be able to demonstrate positive conflict resolution skills through discussion or role plays:   Summary of Patient Progress:       Therapeutic Modalities:   Cognitive Behavioral Therapy Feelings Identification Dialectical Behavioral Therapy   Starleen Arms, LCSW

## 2022-11-14 NOTE — Progress Notes (Signed)
   11/14/22 0528  15 Minute Checks  Location Bedroom  Visual Appearance Calm  Behavior Composed  Sleep (Behavioral Health Patients Only)  Calculate sleep? (Click Yes once per 24 hr at 0600 safety check) Yes  Documented sleep last 24 hours 6.25
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
## 2022-11-14 NOTE — [facility] Group Notes (Signed)
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
## 2022-11-14 NOTE — Progress Notes (Signed)
   11/13/22 2150  Psych Admission Type (Psych Patients Only)  Admission Status Involuntary  Psychosocial Assessment  Patient Complaints Anxiety;Depression  Eye Contact Brief  Facial Expression Anxious  Affect Anxious;Depressed;Sad  Speech Logical/coherent  Interaction Cautious  Motor Activity Fidgety  Appearance/Hygiene Improved  Behavior Characteristics Cooperative;Anxious  Mood Anxious;Depressed  Thought Process  Coherency WDL  Content Blaming others  Delusions None reported or observed  Perception WDL  Hallucination None reported or observed  Judgment Limited  Confusion Severe  Danger to Self  Current suicidal ideation? Denies  Agreement Not to Harm Self Yes  Description of Agreement verbal  Danger to Others  Danger to Others None reported or observed

## 2022-11-14 NOTE — Plan of Care (Signed)
  Problem: Education: Goal: Knowledge of [facility] Education information/materials will improve Outcome: Progressing   Problem: Education: Goal: Mental status will improve Outcome: Progressing   Problem: Education: Goal: Verbalization of understanding the information provided will improve Outcome: Progressing   Problem: Activity: Goal: Sleeping patterns will improve Outcome: Progressing   Problem: Coping: Goal: Ability to verbalize frustrations and anger appropriately will improve Outcome: Progressing   Problem: Coping: Goal: Ability to demonstrate self-control will improve Outcome: Progressing   Problem: Safety: Goal: Periods of time without injury will increase Outcome: Progressing
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
## 2022-11-14 NOTE — Progress Notes (Signed)
Nursing Note: 0700-1900  Goal for today: "To have no breakdowns. To stay calm and cope." Rates that hopelessness is 8/10, anxiety is  5/10 and depression 1/10 this am.  Denies A/V hallucinations and is able to verbally contract for safety. Pt isolated most of the shift in his room, was polite and cooperative while interacting with staff. Pt received first dose of Prozac 30mg  PO today. Education provided prior to administration.  Pt. encouraged to verbalize needs and concerns, active listening and support provided.  Continued Q 15 minute safety checks.  Observed active participation in group settings.   11/14/22 1000  Psych Admission Type (Psych Patients Only)  Admission Status Involuntary  Psychosocial Assessment  Patient Complaints None  Eye Contact Brief  Facial Expression Anxious  Affect Anxious;Depressed  Speech Logical/coherent  Interaction Cautious  Motor Activity Fidgety  Appearance/Hygiene Disheveled  Behavior Characteristics Cooperative;Anxious  Mood Depressed;Anxious  Thought Process  Coherency WDL  Content Blaming others  Delusions None reported or observed  Perception WDL  Hallucination None reported or observed  Judgment Limited  Confusion None  Danger to Self  Current suicidal ideation? Denies  Agreement Not to Harm Self Yes  Description of Agreement Verbal  Danger to Others  Danger to Others None reported or observed

## 2022-11-14 NOTE — Group Note (Signed)
Recreation Therapy Group Note   Group Topic:Animal Assisted Therapy   Group Date: 11/14/2022 Start Time: 0945 End Time: 1030 Facilitators: Toshua Honsinger-McCall, LRT,CTRS Location: [address] Dayroom   Animal-Assisted Activity (AAA) Program Checklist/Progress Notes Patient Eligibility Criteria Checklist & Daily Group note for Rec Tx Intervention  AAA/T Program Assumption of Risk Form signed by Patient/ or Parent Legal Guardian Yes  Patient understands his/her participation is voluntary Yes   Affect/Mood: N/A   Participation Level: Did not attend    Clinical Observations/Individualized Feedback:     Plan: Continue to engage patient in RT group sessions 2-3x/week.   Gedalya Jim-McCall, LRT,CTRS  11/14/2022 1:09 PM

## 2022-11-15 ENCOUNTER — Encounter (HOSPITAL_COMMUNITY): Payer: Self-pay

## 2022-11-15 LAB — LIPID PANEL
Cholesterol: 146 mg/dL (ref 0–200)
HDL: 34 mg/dL — ABNORMAL LOW (ref >40)
LDL Cholesterol: 90 mg/dL (ref 0–99)
Total CHOL/HDL Ratio: 4.3 RATIO
Triglycerides: 108 mg/dL (ref <150)
VLDL: 22 mg/dL (ref 0–40)

## 2022-11-15 LAB — HEMOGLOBIN A1C
Hgb A1c MFr Bld: 4.8 % (ref 4.8–5.6)
Mean Plasma Glucose: 91.06 mg/dL

## 2022-11-15 MED ORDER — RISPERIDONE 1 MG PO TABS
1.0000 mg | ORAL_TABLET | Freq: Every day | ORAL | Status: DC
  Administered 2022-11-15 – 2022-11-18 (×4): 1 mg via ORAL
  Filled 2022-11-15 (×6): qty 1

## 2022-11-15 MED ORDER — FAMOTIDINE 10 MG PO TABS
10.0000 mg | ORAL_TABLET | Freq: Every day | ORAL | Status: DC
  Administered 2022-11-15 – 2022-11-19 (×5): 10 mg via ORAL
  Filled 2022-11-15 (×7): qty 1

## 2022-11-15 MED ORDER — PROPRANOLOL HCL 10 MG PO TABS
10.0000 mg | ORAL_TABLET | Freq: Two times a day (BID) | ORAL | Status: AC
  Administered 2022-11-15 – 2022-11-18 (×6): 10 mg via ORAL
  Filled 2022-11-15 (×7): qty 1

## 2022-11-15 MED ORDER — CLONIDINE HCL 0.1 MG PO TABS
0.1000 mg | ORAL_TABLET | Freq: Three times a day (TID) | ORAL | Status: DC | PRN
  Administered 2022-11-15: 0.1 mg via ORAL
  Filled 2022-11-15: qty 1
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
## 2022-11-15 NOTE — Progress Notes (Signed)
Patient rated his depression level 2/10 and his anxiety level 4/10 with 10 being the highest and 0 none. Patient stated his goal for today as, " My anxiety control". Medication and group compliant, pt observed interacting minimally with some peers. Pt denies SI/HI and AVH. Staples intact to forehead, no active bleeding or drainage noted. Safety maintained.  11/15/22 0810  Psych Admission Type (Psych Patients Only)  Admission Status Involuntary  Psychosocial Assessment  Patient Complaints Anxiety;Depression  Eye Contact Brief  Facial Expression Anxious;Sad  Affect Anxious;Depressed  Speech Logical/coherent  Interaction Childlike  Motor Activity Fidgety  Appearance/Hygiene Improved  Behavior Characteristics Cooperative;Anxious  Mood Depressed;Anxious  Thought Process  Coherency WDL  Content Blaming others  Delusions None reported or observed  Perception WDL  Hallucination None reported or observed  Judgment Limited  Confusion None  Danger to Self  Current suicidal ideation? Denies  Agreement Not to Harm Self Yes  Description of Agreement Verbal  Danger to Others  Danger to Others None reported or observed

## 2022-11-15 NOTE — BH IP Treatment Plan (Signed)
Interdisciplinary Treatment and Diagnostic Plan Update  11/15/2022 Time of Session: 11:00am Douglas Sanchez MRN: [id number]  Principal Diagnosis: Brief psychotic disorder [facility])  Secondary Diagnoses: Principal Problem:   Brief psychotic disorder (HCC)   Current Medications:  Current Facility-Administered Medications  Medication Dose Route Frequency Provider Last Rate Last Admin   acetaminophen (TYLENOL) tablet 650 mg  650 mg Oral Q6H PRN Maryagnes Amos, FNP   650 mg at 11/13/22 2137   alum & mag hydroxide-simeth (MAALOX/MYLANTA) 200-200-20 MG/5ML suspension 30 mL  30 mL Oral Q4H PRN Starkes-Perry, Juel Burrow, FNP       ARIPiprazole (ABILIFY) tablet 5 mg  5 mg Oral Daily Maryagnes Amos, FNP   5 mg at 11/15/22 0813   cloNIDine (CATAPRES) tablet 0.1 mg  0.1 mg Oral TID PRN Rex Kras, MD   0.1 mg at 11/15/22 0917   famotidine (PEPCID) tablet 10 mg  10 mg Oral Daily Carrion-Carrero, Karle Starch, MD   10 mg at 11/15/22 0813   FLUoxetine (PROZAC) capsule 30 mg  30 mg Oral Daily Carrion-Carrero, Margely, MD   30 mg at 11/15/22 5284   magnesium hydroxide (MILK OF MAGNESIA) suspension 30 mL  30 mL Oral Daily PRN Starkes-Perry, Juel Burrow, FNP       OLANZapine zydis (ZYPREXA) disintegrating tablet 5 mg  5 mg Oral Q8H PRN Starkes-Perry, Juel Burrow, FNP       And   ziprasidone (GEODON) injection 20 mg  20 mg Intramuscular PRN Starkes-Perry, Juel Burrow, FNP       traZODone (DESYREL) tablet 100 mg  100 mg Oral QHS Maryagnes Amos, FNP   100 mg at 11/14/22 2144   PTA Medications: No medications prior to admission.    Patient Stressors: Marital or family conflict    Patient Strengths: General fund of knowledge  Supportive family/friends   Treatment Modalities: Medication Management, Group therapy, Case management,  1 to 1 session with clinician, Psychoeducation, Recreational therapy.   Physician Treatment Plan for Primary Diagnosis: Brief psychotic disorder (HCC) Long Term  Goal(s): Improvement in symptoms so as ready for discharge   Short Term Goals: Ability to identify changes in lifestyle to reduce recurrence of condition will improve Ability to verbalize feelings will improve Ability to demonstrate self-control will improve Ability to identify and develop effective coping behaviors will improve Compliance with prescribed medications will improve  Medication Management: Evaluate patient's response, side effects, and tolerance of medication regimen.  Therapeutic Interventions: 1 to 1 sessions, Unit Group sessions and Medication administration.  Evaluation of Outcomes: Progressing  Physician Treatment Plan for Secondary Diagnosis: Principal Problem:   Brief psychotic disorder (HCC)  Long Term Goal(s): Improvement in symptoms so as ready for discharge   Short Term Goals: Ability to identify changes in lifestyle to reduce recurrence of condition will improve Ability to verbalize feelings will improve Ability to demonstrate self-control will improve Ability to identify and develop effective coping behaviors will improve Compliance with prescribed medications will improve     Medication Management: Evaluate patient's response, side effects, and tolerance of medication regimen.  Therapeutic Interventions: 1 to 1 sessions, Unit Group sessions and Medication administration.  Evaluation of Outcomes: Progressing   RN Treatment Plan for Primary Diagnosis: Brief psychotic disorder (HCC) Long Term Goal(s): Knowledge of disease and therapeutic regimen to maintain health will improve  Short Term Goals: Ability to remain free from injury will improve, Ability to verbalize frustration and anger appropriately will improve, Ability to participate in decision making will improve, Ability to  verbalize feelings will improve, Ability to identify and develop effective coping behaviors will improve, and Compliance with prescribed medications will improve  Medication  Management: RN will administer medications as ordered by provider, will assess and evaluate patient's response and provide education to patient for prescribed medication. RN will report any adverse and/or side effects to prescribing provider.  Therapeutic Interventions: 1 on 1 counseling sessions, Psychoeducation, Medication administration, Evaluate responses to treatment, Monitor vital signs and CBGs as ordered, Perform/monitor CIWA, COWS, AIMS and Fall Risk screenings as ordered, Perform wound care treatments as ordered.  Evaluation of Outcomes: Progressing   LCSW Treatment Plan for Primary Diagnosis: Brief psychotic disorder [facility]) Long Term Goal(s): Safe transition to appropriate next level of care at discharge, Engage patient in therapeutic group addressing interpersonal concerns.  Short Term Goals: Engage patient in aftercare planning with referrals and resources, Increase social support, Increase emotional regulation, Facilitate acceptance of mental health diagnosis and concerns, Identify triggers associated with mental health/substance abuse issues, and Increase skills for wellness and recovery  Therapeutic Interventions: Assess for all discharge needs, 1 to 1 time with Social worker, Explore available resources and support systems, Assess for adequacy in community support network, Educate family and significant other(s) on suicide prevention, Complete Psychosocial Assessment, Interpersonal group therapy.  Evaluation of Outcomes: Progressing   Progress in Treatment: Attending groups: No. Participating in groups: No. Taking medication as prescribed: Yes. Toleration medication: Yes. Family/Significant other contact made: No, will contact:  father Mcneil Vallo [phone]  and Carolyne Fiscal [phone] Patient understands diagnosis: Yes. Discussing patient identified problems/goals with staff: Yes. Medical problems stabilized or resolved: No. Denies suicidal/homicidal ideation:  Yes. Issues/concerns per patient self-inventory: No.  New problem(s) identified: No, Describe:  none reported  New Short Term/Long Term Goal(s):medication stabilization, elimination of SI thoughts, development of comprehensive mental wellness plan.    Patient Goals:  "learn coping skills"  Discharge Plan or Barriers: Patient recently admitted. CSW will continue to follow and assess for appropriate referrals and possible discharge planning.    Reason for Continuation of Hospitalization: Aggression Homicidal ideation Medication stabilization Suicidal ideation  Estimated Length of Stay: 5-7 days  Last 3 [place] Suicide Severity Risk Score: Flowsheet Row [facility] (Current) [facility]09[facility]2022 in [facility] ED from 11/06/2022 in [facility] at [facility]  C-SSRS RISK CATEGORY High Risk High Risk No Risk       Last [facility] 2/9 Scores:    09/24/2019    5:24 PM  Depression screen PHQ 2/9  Decreased Interest 0  Down, Depressed, Hopeless 0  PHQ - 2 Score 0  Altered sleeping 0  Tired, decreased energy 0  Change in appetite 0  Feeling bad or failure about yourself  0  Trouble concentrating 0  Moving slowly or fidgety/restless 0  Suicidal thoughts 0  PHQ-9 Score 0  Difficult doing work/chores Not difficult at all    Scribe for Treatment Team: Izell [place], LCSW 11/15/2022 1:53 PM
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
## 2022-11-15 NOTE — Plan of Care (Signed)
  Problem: Education: Goal: Emotional status will improve Outcome: Progressing   Problem: Education: Goal: Mental status will improve Outcome: Progressing   Problem: Activity: Goal: Interest or engagement in activities will improve Outcome: Progressing   Problem: Activity: Goal: Sleeping patterns will improve Outcome: Progressing   Problem: Safety: Goal: Periods of time without injury will increase Outcome: Progressing
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
## 2022-11-15 NOTE — B[facility] Group Notes (Signed)
Adult Psychoeducational Group Note  Date:  11/15/2022 Time:  9:42 PM  Group Topic/Focus:  Wrap-Up Group:   The focus of this group is to help patients review their daily goal of treatment and discuss progress on daily workbooks.  Participation Level:  Did Not Attend  Douglas Sanchez 11/15/2022, 9:42 PM

## 2022-11-15 NOTE — Plan of Care (Signed)
  Problem: Education: Goal: Knowledge of [facility] Education information/materials will improve Outcome: Progressing   Problem: Education: Goal: Emotional status will improve Outcome: Progressing   Problem: Education: Goal: Verbalization of understanding the information provided will improve Outcome: Progressing   Problem: Activity: Goal: Interest or engagement in activities will improve Outcome: Progressing   Problem: Activity: Goal: Sleeping patterns will improve Outcome: Progressing   Problem: Health Behavior/Discharge Planning: Goal: Compliance with treatment plan for underlying cause of condition will improve Outcome: Progressing   Problem: Safety: Goal: Periods of time without injury will increase Outcome: Progressing

## 2022-11-15 NOTE — Group Note (Signed)
Recreation Therapy Group Note   Group Topic:Problem Solving  Group Date: 11/15/2022 Start Time: 0935 End Time: 1005 Facilitators: Timmie Dugue-McCall, LRT,CTRS Location: [address] Dayroom   Goal Area(s) Addresses:  Patient will effectively work with peer towards shared goal.  Patient will identify skills used to make activity successful.  Patient will share challenges and verbalize solution-driven approaches used. Patient will identify how skills used during activity can be used to reach post d/c goals.   Group Description: [employer]. Patients were provided the following materials: 4 drinking straws, 5 rubber bands, 5 paper clips, 2 index cards, 2 drinking cups, and 2 toilet paper rolls. Using the provided materials patients were asked to build a launching mechanism to launch a ping pong ball across the room, approximately 10 feet. Patients were divided into teams of 3-5. Instructions required all materials be incorporated into the device, functionality of items left to the peer group's discretion.   Affect/Mood: N/A   Participation Level: Did not attend    Clinical Observations/Individualized Feedback:     Plan: Continue to engage patient in RT group sessions 2-3x/week.   Bridgid Printz-McCall, LRT,CTRS  11/15/2022 12:32 PM

## 2022-11-15 NOTE — B[facility] Group Notes (Signed)
Spirituality group facilitated by Chaplain Katy Claussen, BCC.   Group Description: Group focused on topic of hope. Patients participated in facilitated discussion around topic, connecting with one another around experiences and definitions for hope. Group members engaged with visual explorer photos, reflecting on what hope looks like for them today. Group engaged in discussion around how their definitions of hope are present today in hospital.   Modalities: Psycho-social ed, Adlerian, Narrative, MI   Patient Progress: Did not attend.  

## 2022-11-15 NOTE — Progress Notes (Signed)
   11/15/22 0602  15 Minute Checks  Location Bedroom  Visual Appearance Calm  Behavior Composed  Sleep (Behavioral Health Patients Only)  Calculate sleep? (Click Yes once per 24 hr at 0600 safety check) Yes  Documented sleep last 24 hours 7.5

## 2022-11-16 MED ORDER — IBUPROFEN 400 MG PO TABS
400.0000 mg | ORAL_TABLET | Freq: Four times a day (QID) | ORAL | Status: DC | PRN
  Filled 2022-11-16: qty 1
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
## 2022-11-16 NOTE — Group Note (Signed)
LCSW Group Therapy Note   Group Date: 11/16/2022 Start Time: 1100 End Time: 1200   Type of Therapy and Topic:  Group Therapy - Who Am I?  Participation Level:  Active  Description of Group The focus of this group was to aid patients in self-exploration and awareness. Patients were guided in exploring various factors of oneself to include interests, readiness to change, management of emotions, and individual perception of self. Patients were provided with complementary worksheets exploring hidden talents, ease of asking other for help, music/media preferences, understanding and responding to feelings/emotions, and hope for the future. At group closing, patients were encouraged to adhere to discharge plan to assist in continued self-exploration and understanding.  Therapeutic Goals Patients learned that self-exploration and awareness is an ongoing process Patients identified their individual skills, preferences, and abilities Patients explored their openness to establish and confide in supports Patients explored their readiness for change and progression of mental health  Summary of Patient Progress:  Patient actively engaged in introductory check-in. Patient actively engaged in activity of self-exploration and identification, completing complementary worksheet to assist in discussion. Patient identified various factors ranging from hidden talents, favorite music and movies, trusted individuals, accountability, and individual perceptions of self and hope. Pt engaged in processing thoughts and feelings as well as means of reframing thoughts. Pt proved receptive of alternate group members input and feedback from CSW.   Therapeutic Modalities Cognitive Behavioral Therapy Motivational Interviewing Kathrynn Humble 11/16/2022  12:35 PM

## 2022-11-16 NOTE — Progress Notes (Signed)
D: Patient alert and oriented, able to make needs known. Denies SI/HI, AVH at present. Reports on going pain of 3/10 in left ankle, see previous note and MAR. Patient goal today "prepare for discharge." Rates depression 1/10 hopelessness 1/10, and anxiety 2/10. Patient reports energy level as normal. He reports he slept poor last night, he reports he could hear the tech walking up and down the hallway every time they completed a fifteen minute check because the shoes were sqeaky. Patient does not request any PRN medication at this time.   A: Scheduled medications administered to patient per MD order. Support and encouragement provided. Routine safety checks conducted every fifteen minutes. Patient informed to notify staff with problems or concerns. Frequent verbal contact made.   R: No adverse drug reactions noted. Patient contracts for safety at this time. Patient is compliant with medications and treatment plan. Patient receptive, calm and cooperative. Patient interacts with others appropriately on unit at present. Patient remains safe at present.

## 2022-11-16 NOTE — Group Note (Signed)
Date:  11/16/2022 Time:  4:03 PM  Group Topic/Focus:  Spirituality:   The focus of this group is to discuss how one's spirituality can aide in recovery.    Participation Level:  Active  Participation Quality:  Appropriate  Affect:  Appropriate  Cognitive:  Appropriate  Insight: Appropriate  Engagement in Group:  Engaged  Modes of Intervention:  Exploration  Additional Comments:     Reymundo Poll 11/16/2022, 4:03 PM

## 2022-11-16 NOTE — Group Note (Signed)
Date:  11/16/2022 Time:  10:56 AM  Group Topic/Focus:  Goals Group:   The focus of this group is to help patients establish daily goals to achieve during treatment and discuss how the patient can incorporate goal setting into their daily lives to aide in recovery.    Participation Level:  Active  Participation Quality:  Appropriate  Affect:  Appropriate  Cognitive:  Appropriate  Insight: Appropriate  Engagement in Group:  Engaged  Modes of Intervention:  Discussion  Additional Comments:     Reymundo Poll 11/16/2022, 10:56 AM

## 2022-11-16 NOTE — Plan of Care (Signed)
  Problem: Education: Goal: Knowledge of  General Education information/materials will improve Outcome: Progressing Goal: Emotional status will improve Outcome: Progressing Goal: Mental status will improve Outcome: Progressing Goal: Verbalization of understanding the information provided will improve Outcome: Progressing   Problem: Coping: Goal: Ability to verbalize frustrations and anger appropriately will improve Outcome: Progressing Goal: Ability to demonstrate self-control will improve Outcome: Progressing   

## 2022-11-16 NOTE — Progress Notes (Signed)
Patient c/o pain in left ankle, 3/10 constant and when he puts any weight on the ankle, its 9/10. Patient declines pain medication or an ice pack at this time. Patient was seen in the ED Two weeks ago and they recommended an ACE wrap but per ED note, patient didn't tolerate the ACE wrap and they discharged him home with crutches. Encouraged pt to use the walker he has in his room if its helpful but he stated, "using it is loud and embarrassing, I will be ok." Verbal support and encouragement given. Dr. Theodis Aguas notified.

## 2022-11-16 NOTE — [facility] Group Notes (Signed)
[facility] Group Notes:  (Nursing/MHT/Case Management/Adjunct)  Date:  11/16/2022  Time: 2000  Type of Therapy:   Wrap up group  Participation Level:  Active  Participation Quality:  Appropriate, Attentive, Sharing, and Supportive  Affect:  Flat  Cognitive:  Alert  Insight:  Improving  Engagement in Group:  Developing/Improving  Modes of Intervention:  Clarification, Education, and Support  Summary of Progress/Problems: Positive thinking and positive change were discussed.   Douglas Sanchez 11/16/2022, 9:10 PM

## 2022-11-17 NOTE — Progress Notes (Signed)
Physical Therapy Note.  Order received related to  patient's recent ankle sprain injury  and use of assistive device. Patient was issued crutches in  the ED and declined compression devices at the time of evaluation.  Noted in  epic that patient does not want to use the RW. At this time, PT has no other suggestions to alleviate the issue unless he is allowed to use the crutches. PT will sign off. Blanchard Kelch PT Acute Rehabilitation Services Office [phone] Weekend [phone]

## 2022-11-17 NOTE — Progress Notes (Signed)
   11/17/22 0800  Psych Admission Type (Psych Patients Only)  Admission Status Involuntary  Psychosocial Assessment  Patient Complaints Anxiety;Depression  Eye Contact Fair  Facial Expression Anxious;Animated  Affect Anxious  Speech Logical/coherent  Interaction Childlike  Motor Activity Fidgety  Appearance/Hygiene Unremarkable  Behavior Characteristics Cooperative;Appropriate to situation  Mood Anxious;Pleasant  Thought Process  Coherency WDL  Content Blaming others  Delusions None reported or observed  Perception WDL  Hallucination None reported or observed  Judgment Limited  Confusion None  Danger to Self  Current suicidal ideation? Denies  Agreement Not to Harm Self Yes  Description of Agreement verbally contracts for safety  Danger to Others  Danger to Others None reported or observed

## 2022-11-17 NOTE — Progress Notes (Signed)
   11/17/22 2334  Psych Admission Type (Psych Patients Only)  Admission Status Involuntary  Psychosocial Assessment  Patient Complaints Anxiety;Depression  Eye Contact Fair  Facial Expression Anxious;Animated  Affect Anxious  Speech Financial planner;Childlike  Motor Activity Fidgety  Appearance/Hygiene Unremarkable  Behavior Characteristics Cooperative;Appropriate to situation  Mood Anxious;Pleasant  Thought Process  Coherency WDL  Content Blaming others  Delusions None reported or observed  Perception WDL  Hallucination None reported or observed  Judgment Limited  Confusion None  Danger to Self  Current suicidal ideation? Denies  Agreement Not to Harm Self Yes  Description of Agreement verbal  Danger to Others  Danger to Others None reported or observed

## 2022-11-17 NOTE — Plan of Care (Signed)

## 2022-11-17 NOTE — Progress Notes (Signed)
   11/17/22 0556  15 Minute Checks  Location Bedroom  Visual Appearance Calm  Behavior Composed  Sleep (Behavioral Health Patients Only)  Calculate sleep? (Click Yes once per 24 hr at 0600 safety check) Yes  Documented sleep last 24 hours 7
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
## 2022-11-17 NOTE — Progress Notes (Signed)
   11/16/22 2134  Psych Admission Type (Psych Patients Only)  Admission Status Involuntary  Psychosocial Assessment  Patient Complaints Anxiety;Depression;Sleep disturbance  Eye Contact Fair  Facial Expression Animated;Anxious  Affect Anxious;Appropriate to circumstance  Speech Logical/coherent  Interaction Childlike  Motor Activity Fidgety  Appearance/Hygiene Unremarkable  Behavior Characteristics Cooperative;Appropriate to situation;Anxious  Mood Anxious;Pleasant  Thought Process  Coherency WDL  Content Blaming self  Delusions None reported or observed  Perception WDL  Hallucination None reported or observed  Judgment Limited  Confusion None  Danger to Self  Current suicidal ideation? Denies  Agreement Not to Harm Self Yes  Description of Agreement verbally contracts for safety  Danger to Others  Danger to Others None reported or observed

## 2022-11-17 NOTE — [facility] Group Notes (Signed)
The focus of this group is to help patients establish daily goals to achieve during treatment and discuss how the patient can incorporate goal setting into their daily lives to aide in recovery.     Scale 1-10 7 out of 10         Goal Stay out of my room

## 2022-11-17 NOTE — Group Note (Signed)
Recreation Therapy Group Note   Group Topic:Relaxation  Group Date: 11/17/2022 Start Time: 1610 End Time: 1015 Facilitators: Tyechia Allmendinger-McCall, LRT,CTRS Location: [address] Dayroom   Goal Area(s) Addresses:  Patient will identify positive stress management techniques. Patient will identify benefits of using stress management post d/c.  Group Description: Meditation.  LRT and patients discussed the importance of taking time to release stress and clear the mind. LRT played a meditation that walked patients through breathing techniques and ways to refocus on positive feelings and mindset.    Affect/Mood: Appropriate   Participation Level: Engaged   Participation Quality: Independent   Behavior: Appropriate   Speech/Thought Process: Focused   Insight: Good   Judgement: Good   Modes of Intervention: Meditation   Patient Response to Interventions:  Engaged   Education Outcome:  Acknowledges education   Clinical Observations/Individualized Feedback: Pt attended and participated in group session.    Plan: Continue to engage patient in RT group sessions 2-3x/week.   Esai Stecklein-McCall, LRT,CTRS 11/17/2022 11:55 AM

## 2022-11-18 DIAGNOSIS — F23 Brief psychotic disorder: Secondary | ICD-10-CM | POA: Diagnosis not present

## 2022-11-18 NOTE — Progress Notes (Signed)
   11/18/22 1000  Psych Admission Type (Psych Patients Only)  Admission Status Involuntary  Psychosocial Assessment  Patient Complaints None  Eye Contact Fair  Facial Expression Animated  Affect Appropriate to circumstance  Speech Logical/coherent  Interaction Assertive  Motor Activity Fidgety  Appearance/Hygiene Unremarkable  Behavior Characteristics Cooperative;Appropriate to situation  Mood Pleasant;Anxious  Thought Process  Coherency WDL  Content WDL  Delusions None reported or observed  Perception WDL  Hallucination None reported or observed  Judgment Limited  Confusion None  Danger to Self  Current suicidal ideation? Denies  Danger to Others  Danger to Others None reported or observed

## 2022-11-18 NOTE — [facility] Group Notes (Signed)
Psychoeducational Group Note  Date:  11/18/2022 Time:  2015  Group Topic/Focus:  Wrap up group  Participation Level: Did Not Attend  Participation Quality:  Not Applicable  Affect:  Not Applicable  Cognitive:  Not Applicable  Insight:  Not Applicable  Engagement in Group: Not Applicable  Additional Comments:  Did not attend.   Marcille Buffy 11/18/2022, 9:27 PM
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
## 2022-11-18 NOTE — Progress Notes (Signed)
Pt states he got upset because he was told from his Mother that he wasn't going to be discharge until a week from today, Pt got upset and threw a bath chair out of his room. A show of support was called and MD was notified. Pt was able to calm down and requested PRN for agitation, Zyprexa 5 was given. Pt apologize for his behavior and was told he might possibly get to go home tomorrow and that it was all a miscommunication on his part.

## 2022-11-18 NOTE — Plan of Care (Signed)
  Problem: Education: Goal: Knowledge of [facility] Education information/materials will improve Outcome: Progressing Goal: Emotional status will improve Outcome: Progressing Goal: Mental status will improve Outcome: Progressing Goal: Verbalization of understanding the information provided will improve Outcome: Progressing   

## 2022-11-18 NOTE — [facility] Group Notes (Signed)
LCSW Wellness Group Note   11/18/2022 1:00pm  Type of Group and Topic: Psychoeducational Group:  Wellness  Participation Level:  minimal  Description of Group  Wellness group introduces the topic and its focus on developing healthy habits across the spectrum and its relationship to a decrease in hospital admissions.  Six areas of wellness are discussed: physical, social spiritual, intellectual, occupational, and emotional.  Patients are asked to consider their current wellness habits and to identify areas of wellness where they are interested and able to focus on improvements.    Therapeutic Goals Patients will understand components of wellness and how they can positively impact overall health.  Patients will identify areas of wellness where they have developed good habits. Patients will identify areas of wellness where they would like to make improvements.    Summary of Patient Progress: pt appeared attentive to group discussion but did not participate.  Pt did respond when called on by CSW, identified intellectual and emotional as wellness areas of strength and social as a wellness area that needed improvement.       Therapeutic Modalities: Cognitive Behavioral Therapy Psychoeducation    Lorri Frederick, LCSW

## 2022-11-18 NOTE — [facility] Suicide Risk Assessment (Addendum)
[facility] INPATIENT:  Family/Significant Other Suicide Prevention Education  Suicide Prevention Education:  Education Completed;  Douglas Sanchez, mother, [phone],  has been identified by the patient as the family member/significant other with whom the patient will be residing, and identified as the person(s) who will aid the patient in the event of a mental health crisis (suicidal ideations/suicide attempt).  With written consent from the patient, the family member/significant other has been provided the following suicide prevention education, prior to the and/or following the discharge of the patient.  The suicide prevention education provided includes the following: Suicide risk factors Suicide prevention and interventions National Suicide Hotline telephone number [facility] assessment telephone number [facility] Emergency Assistance 911 [facility] and/or Residential Mobile Crisis Unit telephone number  Request made of family/significant other to: Remove weapons (e.g., guns, rifles, knives), all items previously/currently identified as safety concern.  No guns in the home, per [facility] drugs/medications (over-the-counter, prescriptions, illicit drugs), all items previously/currently identified as a safety concern.  The family member/significant other verbalizes understanding of the suicide prevention education information provided.  The family member/significant other agrees to remove the items of safety concern listed above.  Mother reports the following: she is bio mother, pt was removed by CPS and placed in foster care for about 10 years.  Pt returned to her and his father Fayrene Fearing' home as soon as he turned 45.  This was about 2 years ago.  He has not had any mental health treatment since his return.  He is quite disrespectful, "turns on a dime."  He is not doing anything productive such as work or school and basically refuses to leave the house.  She and his father would  very much  like to be involved with some sort of family counseling moving forward.  She is also requesting a call from the providers prior to discharge.   Lorri Frederick 11/18/2022, 12:57 PM
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
## 2022-11-18 NOTE — [facility] Suicide Risk Assessment (Signed)
[facility] INPATIENT:  Family/Significant Other Suicide Prevention Education  Suicide Prevention Education:  Contact Attempts: Carolyne Fiscal, mother, [phone],  has been identified by the patient as the family member/significant other with whom the patient will be residing, and identified as the person(s) who will aid the patient in the event of a mental health crisis.  With written consent from the patient, two attempts were made to provide suicide prevention education, prior to and/or following the patient's discharge.  We were unsuccessful in providing suicide prevention education.  A suicide education pamphlet was given to the patient to share with family/significant other.  Date and time of first attempt:11/18/22, 1130 Date and time of second attempt:  Lorri Frederick 11/18/2022, 11:32 AM

## 2022-11-18 NOTE — Plan of Care (Signed)
  Problem: Coping: Goal: Ability to verbalize frustrations and anger appropriately will improve Outcome: Not Progressing Goal: Ability to demonstrate self-control will improve Outcome: Not Progressing   

## 2022-11-18 NOTE — Progress Notes (Signed)
   11/18/22 2138  Psych Admission Type (Psych Patients Only)  Admission Status Involuntary  Psychosocial Assessment  Patient Complaints None  Eye Contact Fair  Facial Expression Animated  Affect Appropriate to circumstance  Speech Logical/coherent  Interaction Assertive  Motor Activity Fidgety  Appearance/Hygiene Unremarkable  Behavior Characteristics Cooperative;Appropriate to situation  Mood Anxious;Pleasant  Thought Process  Coherency WDL  Content WDL  Delusions None reported or observed  Perception WDL  Hallucination None reported or observed  Judgment Limited  Confusion None  Danger to Self  Current suicidal ideation? Denies  Agreement Not to Harm Self Yes  Description of Agreement verbal  Danger to Others  Danger to Others None reported or observed

## 2022-11-18 NOTE — [facility] Group Notes (Signed)
[facility] Group Notes:  (Nursing/MHT/Case Management/Adjunct)  Date:  11/18/2022  Time:  8:57 AM  Type of Therapy:   Goals group  Participation Level:  Active  Participation Quality:  Appropriate  Affect:  Appropriate  Cognitive:  Appropriate  Insight:  Appropriate  Engagement in Group:  Engaged  Modes of Intervention:  Orientation  Summary of Progress/Problems: Goal is to stay out of his room  Azalee Course 11/18/2022, 8:57 AM

## 2022-11-18 NOTE — [facility] Group Notes (Signed)
[facility] Group Notes:  (Nursing/MHT/Case Management/Adjunct)  Date:  11/18/2022  Time: 1400  Type of Therapy:  Nurse Education  Participation Level:  Did Not Attend   Shela Nevin 11/18/2022, 7:15 PM

## 2022-11-19 DIAGNOSIS — F23 Brief psychotic disorder: Secondary | ICD-10-CM

## 2022-11-19 MED ORDER — RISPERIDONE 1 MG PO TABS: 1.0000 mg | ORAL_TABLET | Freq: Every day | ORAL | 0 refills | Status: AC

## 2022-11-19 MED ORDER — FLUOXETINE HCL 10 MG PO CAPS: 30.0000 mg | ORAL_CAPSULE | Freq: Every day | ORAL | 0 refills | Status: AC

## 2022-11-19 NOTE — Progress Notes (Addendum)
D. Pt presents anxious, but friendly, voiced no complaints- stated that he was looking forward to discharging today. Per pt's self inventory, pt rated his depression,hopelessness and anxiety a 0/0/3, respectively. Pt currently denies pain,  SI/HI and AVH and is tolerating his medications well  A. Labs and vitals monitored. Pt given and educated on medications. All 4 staples removed per verbal order. Pt supported emotionally and encouraged to express concerns and ask questions.   R. Pt remains safe with 15 minute checks. Will continue POC.    11/19/22 0900  Psych Admission Type (Psych Patients Only)  Admission Status Involuntary  Psychosocial Assessment  Patient Complaints None  Eye Contact Fair  Facial Expression Anxious  Affect Appropriate to circumstance  Speech Logical/coherent  Interaction Assertive  Motor Activity Fidgety  Appearance/Hygiene Unremarkable  Behavior Characteristics Cooperative;Appropriate to situation  Mood Anxious;Pleasant  Thought Process  Coherency WDL  Content WDL  Delusions None reported or observed  Perception WDL  Hallucination None reported or observed  Judgment Limited  Confusion None  Danger to Self  Current suicidal ideation? Denies  Danger to Others  Danger to Others None reported or observed

## 2022-11-19 NOTE — Discharge Summary (Signed)
Physician Discharge Summary Note Patient:  Douglas Sanchez is an 22 y.o., male MRN:  [id number] DOB:  July 02, 2000 Patient phone:  [phone] (home)  Patient address:   [address] [place] [address],  Total Time spent with patient: 20 minutes  Date of Admission:  11/13/2022 Date of Discharge: 11/19/2022  Reason for Admission:  aggressive behavior  Principal Problem: Brief psychotic disorder [facility]) Discharge Diagnoses: Principal Problem:   Brief psychotic disorder [facility])   Past Psychiatric History: Current Psychiatrist: N/a Current Therapist: None Previous Psych Diagnoses: ADHD, ODD, GAD, "bipolar at age 3 or 55." Current psychiatric medications: None Psychiatric medication history:             Terri Skains- "for a long time. Reached the max dose which didn't do anything."             Depakote- for epilepsy, as noted below             Trazadone- Max dose, has not taken for 4 year and says that it did not help             Rexulti- does not recall why             Atomoxetine- ADHD, did not help per patient Prior inpatient treatment: Multiple psychiatric hospitalizations for behavior and self harm Current/prior outpatient treatment: None Prior rehab hx: None Psychotherapy hx: Does not recall History of suicide attempt: As described in HPI History of homicide or aggression: as described in the HPI Psychiatric medication compliance history: Stopped taking meds at 20 on his own accord Neuromodulation history: None  Past Medical History:  Past Medical History:  Diagnosis Date   ADHD (attention deficit hyperactivity disorder)    ADHD (attention deficit hyperactivity disorder)    Aggressive behavior 07/04/2012   Allergy    Anxiety    Obesity    OCD (obsessive compulsive disorder)    ODD (oppositional defiant disorder)    Seizures (HCC)     Past Surgical History:  Procedure Laterality Date   HYPOSPADIAS CORRECTION     Family History: Medical: Seizure in paternal  grandma.  Psych: Father- Pissble undaignosed bipolar, ODD. Mother- GAD, MDD SA/HA: Suicide attempts by 2 brothers and 1 sister. His second sister recently passed away (pt deferred conversation) Substance use family hx: No abuse   Social History: Living (Where patient is from, where do they live, who do they live with): At home with adoptive mother, father, and aunt. Education: Wants to go back to get his GED Work: Quit job as noted above Finances: N/a Marital Status: Single Children: None   Abuse: None except as detailed in HPI Legal: None Military: None  Hospital Course:   During the patient's hospitalization, patient had extensive initial psychiatric evaluation, and follow-up psychiatric evaluations every day.  Psychiatric diagnoses provided upon initial assessment: Principal Problem:   Brief psychotic disorder (HCC)    Upon admission, the following medications were changed / started / discontinued: -- Start Prozac 30 mg PO daily for anxiety -- Continue Abilify 5 mg PO daily as adjunctive therapy for anxiety  During the patient's stay, the following medications were changed / started / discontinued, with final adjustments by discharge: -- Continued the medications above  During the hospitalization, patient had the following lab / imaging / testing abnormalities which require further evaluation / management / treatment: none  Patient's care was discussed during the interdisciplinary team meeting every day during the hospitalization.  The patient denies any side effects to prescribed psychiatric medication.  Douglas Sanchez is a 22 year old male w/ PMHx of ADHD, anxiety, OCD, ODD, and seizures who presents under IVC after an aggressive outburst, self harm attempts, and homicidal ideation/statements.   Gradually, patient started adjusting to milieu. The patient was evaluated each day by a clinical provider to ascertain response to treatment. Improvement was noted by the  patient's report of decreasing symptoms, improved sleep and appetite, affect, medication tolerance, behavior, and participation in unit programming.  Patient was asked each day to complete a self inventory noting mood, mental status, pain, new symptoms, anxiety and concerns.    Symptoms were reported as significantly decreased or resolved completely by discharge.   On day of discharge, the patient reports that their mood is stable. The patient denied having suicidal thoughts for more than 48 hours prior to discharge.  Patient denies having homicidal thoughts.  Patient denies having auditory hallucinations.  Patient denies any visual hallucinations or other symptoms of psychosis. The patient was motivated to continue taking medication with a goal of continued improvement in mental health.   Patient denies ever intentionally hurting his mother nor having thoughts of harming his mother and stated that he threw the chair to release feelings of generalized and not targeted frustration.  The patient reports their target psychiatric symptoms of aggression responded well to the psychiatric medications, and the patient reports overall benefit other psychiatric hospitalization. Supportive psychotherapy was provided to the patient. The patient also participated in regular group therapy while hospitalized. Coping skills, problem solving as well as relaxation therapies were also part of the unit programming.  Labs were reviewed with the patient, and abnormal results were discussed with the patient.  The patient is able to verbalize their individual safety plan to this provider.  Behavioral Events: none  Restraints: none  # It is recommended to the patient to continue psychiatric medications as prescribed, after discharge from the hospital.    # It is recommended to the patient to follow up with your outpatient psychiatric provider and PCP.  # It was discussed with the patient, the impact of alcohol, drugs,  tobacco have been there overall psychiatric and medical wellbeing, and total abstinence from substance use was recommended to the patient.  # Prescriptions provided or sent directly to preferred pharmacy at discharge. Patient agreeable to plan. Given opportunity to ask questions. Appears to feel comfortable with discharge.    # In the event of worsening symptoms, the patient is instructed to call the crisis hotline, 911 and or go to the nearest ED for appropriate evaluation and treatment of symptoms. To follow-up with primary care provider for other medical issues, concerns and or health care needs  # Patient was discharged home with a plan to follow up as noted below.  Physical Findings: AIMS:  , ,  ,  ,    CIWA:    COWS:     Mental Status Exam: General Appearance: appears at stated age, casually dressed and groomed   Behavior: pleasant and cooperative   Psychomotor Activity: no psychomotor agitation or retardation noted   Eye Contact: fair  Speech: normal amount, tone, volume and fluency    Mood: euthymic  Affect: congruent, pleasant and interactive   Thought Process: linear, goal directed, no circumstantial or tangential thought process noted, no racing thoughts or flight of ideas  Descriptions of Associations: intact  Thought Content: no bizarre content, logical and future-oriented  Hallucinations: denies AH, VH , does not appear responding to stimuli  Delusions: no paranoia, delusions  of control, grandeur, ideas of reference, thought broadcasting, and magical thinking  Suicidal Thoughts: denies SI, intention, plan  Homicidal Thoughts: denies HI, intention, plan   Alertness/Orientation: alert and fully oriented   Insight: fair, improved  Judgment: fair, improved   Memory: intact   Executive Functions  Concentration: intact  Attention Span: fair  Recall: intact  Fund of Knowledge: fair    Physical Exam Vitals reviewed.  Constitutional:      Appearance: Normal  appearance.  HENT:     Head: Normocephalic.  Cardiovascular:     Rate and Rhythm: Normal rate.  Pulmonary:     Effort: Pulmonary effort is normal.  Neurological:     Mental Status: He is alert.    Review of Systems  Constitutional:  Negative for chills and fever.  Cardiovascular:  Negative for chest pain.  Gastrointestinal:  Negative for nausea and vomiting.    Blood pressure 130/89, pulse 95, temperature 97.9 F (36.6 C), temperature source Oral, resp. rate 18, height 5\' 8"  (1.727 m), weight 117.9 kg, SpO2 98%. Body mass index is 39.53 kg/m.  Assets  Assets:Communication Skills; Desire for Improvement; Resilience   Social History   Tobacco Use  Smoking Status Some Days   Types: Cigarettes  Smokeless Tobacco Never   Tobacco Cessation:  A prescription for an FDA-approved tobacco cessation medication provided at discharge  Blood Alcohol level:  Lab Results  Component Value Date   ETH <10 11/09/2022   ETH <10 06/29/2017    Metabolic Disorder Labs:  Lab Results  Component Value Date   HGBA1C 4.8 11/15/2022   MPG 91.06 11/15/2022   MPG 93.93 12/06/2016   Lab Results  Component Value Date   PROLACTIN 14.2 12/08/2016   Lab Results  Component Value Date   CHOL 146 11/15/2022   TRIG 108 11/15/2022   HDL 34 (L) 11/15/2022   CHOLHDL 4.3 11/15/2022   VLDL 22 11/15/2022   LDLCALC 90 11/15/2022   LDLCALC 65 12/06/2016    Discharge destination: home  Is patient on multiple antipsychotic therapies at discharge:  No   Has Patient had three or more failed trials of antipsychotic monotherapy by history:  No  Recommended Plan for Multiple Antipsychotic Therapies: NA   Allergies as of 11/19/2022       Reactions   Latex Swelling, Rash   Swelling occurs at the site of contact        Medication List     TAKE these medications      Indication  FLUoxetine 10 MG capsule Commonly known as: PROZAC Take 3 capsules (30 mg total) by mouth daily. Start taking  on: November 20, 2022  Indication: Generalized Anxiety Disorder   risperiDONE 1 MG tablet Commonly known as: RISPERDAL Take 1 tablet (1 mg total) by mouth at bedtime.  Indication: Decrease aggressive outbursts        Follow-up Information     [facility]. Go to.   Specialty: Behavioral Health Why: Please go to this provider for an assessment, to obtain therapy and medication management services.  For fastest service, please go on Monday through Friday, arrive by 7:00 am for same day service. Contact information: [address] [place] [address] [phone]        Vivi Martens, Counselor. Go on 11/30/2022.   Why: You have an assessment appointment to re-establish care for therapy services on 11/30/22 at 9:00 am, in person.  * TO KEEP THIS APPT YOU MUST : Submit intake forms  online at:  [email or web address]   Then choose client portal, adult medicaid intake form, follow prompts and then 'submit'.  You will need your [facility] number: [phone] L Contact information: [facility]  [address] [place], [place] [address]  Phone: [phone]                Discharge recommendations:   Activity: as tolerated  Diet: heart healthy  # It is recommended to the patient to continue psychiatric medications as prescribed, after discharge from the hospital.     # It is recommended to the patient to follow up with your outpatient psychiatric provider -instructions on appointment date, time, and address (location) are provided to you in discharge paperwork  # Follow-up with outpatient primary care doctor and other specialists -for management of chronic medical disease, including: HTN  # Testing: Follow-up with outpatient provider for abnormal lab results: none   # It was discussed with the patient, the impact of alcohol, drugs, tobacco have been there overall psychiatric and medical wellbeing, and total  abstinence from substance use was recommended to the patient.   # Prescriptions provided or sent directly to preferred pharmacy at discharge. Patient agreeable to plan. Given opportunity to ask questions. Appears to feel comfortable with discharge.    # In the event of worsening symptoms, the patient is instructed to call the crisis hotline, 911, and or go to the nearest ED for appropriate evaluation and treatment of symptoms. To follow-up with primary care provider for other medical issues, concerns and or health care needs  Patient agrees with D/C instructions and plan.   I discussed my assessment, planned testing and intervention for the patient with Dr. Abbott Pao who agrees with my formulated course of action.   Signed: Lance Muss, MD, PGY-2 11/19/2022, 9:10 AM

## 2022-11-19 NOTE — Progress Notes (Signed)
Pt discharged to lobby where parents were waiting. Pt was stable and appreciative at that time. All papers and prescriptions were given and valuables returned. Suicide safety plan completed and copy given to patient. Verbal understanding expressed. Denies SI/HI and A/VH. Pt given opportunity to express concerns and ask questions.

## 2022-11-19 NOTE — Plan of Care (Signed)
  Problem: Education: Goal: Knowledge of [facility] Education information/materials will improve Outcome: Progressing Goal: Emotional status will improve Outcome: Progressing Goal: Mental status will improve Outcome: Progressing Goal: Verbalization of understanding the information provided will improve Outcome: Progressing   Problem: Activity: Goal: Interest or engagement in activities will improve Outcome: Progressing   

## 2022-11-19 NOTE — BHH Suicide Risk Assessment (Signed)
Suicide Risk Assessment  Discharge Assessment    [facility] Discharge Suicide Risk Assessment   Principal Problem: Brief psychotic disorder [facility]) Discharge Diagnoses: Principal Problem:   Brief psychotic disorder (HCC)   Total Time spent with patient: 30 minutes  Douglas Sanchez is a 22 year old male w/ PMHx of ADHD, anxiety, OCD, ODD, and seizures who presents under IVC after an aggressive outburst, self harm attempts, and homicidal ideation/statements.   During the patient's hospitalization, patient had extensive initial psychiatric evaluation, and follow-up psychiatric evaluations every day.  Psychiatric diagnoses provided upon initial assessment: brief psychotic d/o  Patient's psychiatric medications were adjusted on admission: Prozac, Abilify  Patient's care was discussed during the interdisciplinary team meeting every day during the hospitalization.  The patient denies having side effects to prescribed psychiatric medication.  Gradually, patient started adjusting to milieu. The patient was evaluated each day by a clinical provider to ascertain response to treatment. Improvement was noted by the patient's report of decreasing symptoms, improved sleep and appetite, affect, medication tolerance, behavior, and participation in unit programming.  Patient was asked each day to complete a self inventory noting mood, mental status, pain, new symptoms, anxiety and concerns.    Symptoms were reported as significantly decreased or resolved completely by discharge.   On day of discharge, the patient reports that their mood is stable. The patient denied having suicidal thoughts for more than 48 hours prior to discharge.  Patient denies having homicidal thoughts.  Patient denies having auditory hallucinations.  Patient denies any visual hallucinations or other symptoms of psychosis. The patient was motivated to continue taking medication with a goal of continued improvement in mental health.   The  patient reports their target psychiatric symptoms of aggression responded well to the psychiatric medications, and the patient reports overall benefit other psychiatric hospitalization. Supportive psychotherapy was provided to the patient. The patient also participated in regular group therapy while hospitalized. Coping skills, problem solving as well as relaxation therapies were also part of the unit programming.  Labs were reviewed with the patient, and abnormal results were discussed with the patient.  The patient is able to verbalize their individual safety plan to this provider.  # It is recommended to the patient to continue psychiatric medications as prescribed, after discharge from the hospital.    # It is recommended to the patient to follow up with your outpatient psychiatric provider and PCP.  # It was discussed with the patient, the impact of alcohol, drugs, tobacco have been there overall psychiatric and medical wellbeing, and total abstinence from substance use was recommended the patient.ed.  # Prescriptions provided or sent directly to preferred pharmacy at discharge. Patient agreeable to plan. Given opportunity to ask questions. Appears to feel comfortable with discharge.    # In the event of worsening symptoms, the patient is instructed to call the crisis hotline, 911 and or go to the nearest ED for appropriate evaluation and treatment of symptoms. To follow-up with primary care provider for other medical issues, concerns and or health care needs  # Patient was discharged home with a plan to follow up as noted below.    Musculoskeletal: Strength & Muscle Tone: within normal limits Gait & Station: normal Patient leans: N/A  Psychiatric Specialty Exam  General Appearance: appears at stated age, casually dressed and groomed    Behavior: pleasant and cooperative    Psychomotor Activity: no psychomotor agitation or retardation noted    Eye Contact: fair  Speech: normal  amount, tone,  volume and fluency      Mood: euthymic  Affect: congruent, pleasant and interactive    Thought Process: linear, goal directed, no circumstantial or tangential thought process noted, no racing thoughts or flight of ideas  Descriptions of Associations: intact  Thought Content: no bizarre content, logical and future-oriented  Hallucinations: denies AH, VH , does not appear responding to stimuli  Delusions: no paranoia, delusions of control, grandeur, ideas of reference, thought broadcasting, and magical thinking  Suicidal Thoughts: denies SI, intention, plan  Homicidal Thoughts: denies HI, intention, plan    Alertness/Orientation: alert and fully oriented    Insight: fair, improved  Judgment: fair, improved    Memory: intact   Physical Exam: Physical Exam Vitals reviewed.  Constitutional:      Appearance: Normal appearance.  HENT:     Head: Normocephalic.  Cardiovascular:     Rate and Rhythm: Normal rate.  Pulmonary:     Effort: Pulmonary effort is normal.  Neurological:     Mental Status: He is alert.      Review of Systems  Constitutional:  Negative for chills and fever.  Cardiovascular:  Negative for chest pain.  Gastrointestinal:  Negative for nausea and vomiting.  Blood pressure 130/89, pulse 95, temperature 97.9 F (36.6 C), temperature source Oral, resp. rate 18, height 5\' 8"  (1.727 m), weight 117.9 kg, SpO2 98%. Body mass index is 39.53 kg/m.  Mental Status Per Nursing Assessment::   On Admission:  Suicidal ideation indicated by patient, Self-harm behaviors  Demographic Factors:  Male, Adolescent or young adult, and Caucasian Loss Factors: Decline in physical health Historical Factors: Impulsivity Risk Reduction Factors:   Living with another person, especially a relative, Positive therapeutic relationship, and Positive coping skills or problem solving skills   Continued Clinical Symptoms:  Unstable or Poor Therapeutic  Relationship  Cognitive Features That Contribute To Risk:  None    Suicide Risk:  Mild: There are no identifiable suicide plans, no associated intent, mild dysphoria and related symptoms, good self-control (both objective and subjective assessment), few other risk factors, and identifiable protective factors, including available and accessible social support.    Follow-up Information     [facility]. Go to.   Specialty: Behavioral Health Why: Please go to this provider for an assessment, to obtain therapy and medication management services.  For fastest service, please go on Monday through Friday, arrive by 7:00 am for same day service. Contact information: [address] [place] [address] [phone]        Vivi Martens, Counselor. Go on 11/30/2022.   Why: You have an assessment appointment to re-establish care for therapy services on 11/30/22 at 9:00 am, in person.  * TO KEEP THIS APPT YOU MUST : Submit intake forms online at:  [email or web address]   Then choose client portal, adult medicaid intake form, follow prompts and then 'submit'.  You will need your [facility] number: [phone] L Contact information: [facility]  [address] [place], [place] [address]  Phone: [phone]                Plan Of Care/Follow-up recommendations:  Activity: as tolerated  Diet: heart healthy  Other: -Follow-up with your outpatient psychiatric provider -instructions on appointment date, time, and address (location) are provided to you in discharge paperwork.  -Take your psychiatric medications as prescribed at discharge - instructions are provided to you in the discharge paperwork  -Follow-up with outpatient primary care doctor and other  specialists -for management of preventative medicine and chronic medical disease, including: HTN  -Testing: Follow-up with outpatient provider for abnormal lab  results: none  -Recommend abstinence from alcohol, tobacco, and other illicit drug use at discharge.   -If your psychiatric symptoms recur, worsen, or if you have side effects to your psychiatric medications, call your outpatient psychiatric provider, 911, [phone] or go to the nearest emergency department.  -If suicidal thoughts recur, call your outpatient psychiatric provider, 911, [phone] or go to the nearest emergency department.     Lance Muss, MD 11/19/2022, 8:11 AM

## 2022-11-19 NOTE — Progress Notes (Signed)
  [facility] Adult Case Management Discharge Plan :  Will you be returning to the same living situation after discharge:  Yes,  Patient will be returning home. At discharge, do you have transportation home?: Yes,  Patient states dad will be picking him up at discharge.  Do you have the ability to pay for your medications: Yes,  Patient states he has insurance.  Release of information consent forms completed and in the chart;  Patient's signature needed at discharge.  Patient to Follow up at:  Follow-up Information     [facility]. Go to.   Specialty: Behavioral Health Why: Please go to this provider for an assessment, to obtain therapy and medication management services.  For fastest service, please go on Monday through Friday, arrive by 7:00 am for same day service. Contact information: [address] [place] [address] [phone]        Vivi Martens, Counselor. Go on 11/30/2022.   Why: You have an assessment appointment to re-establish care for therapy services on 11/30/22 at 9:00 am, in person.  * TO KEEP THIS APPT YOU MUST : Submit intake forms online at:  [email or web address]   Then choose client portal, adult medicaid intake form, follow prompts and then 'submit'.  You will need your [facility] number: [phone] L Contact information: [name]  [address] [place], [place] [address]  Phone: [phone]                Next level of care provider has access to [facility] Link:no  Safety Planning and Suicide Prevention discussed: Yes,  Completed with Corrie Dandy 11/17/22     Has patient been referred to the Quitline?: Patient does not use tobacco/nicotine products  Patient has been referred for addiction treatment: Patient refused referral for treatment.  Allsion [place] O Johnnie Moten, LCSWA 11/19/2022, 9:58 AM

## 2023-06-03 ENCOUNTER — Encounter (HOSPITAL_COMMUNITY): Payer: Self-pay

## 2023-06-03 ENCOUNTER — Emergency Department (HOSPITAL_COMMUNITY): Payer: MEDICAID

## 2023-06-03 ENCOUNTER — Emergency Department (HOSPITAL_COMMUNITY)
Admission: EM | Admit: 2023-06-03 | Discharge: 2023-06-04 | Discharge status: Against Medical Advice | Payer: MEDICAID | Attending: Emergency Medicine | Admitting: Emergency Medicine

## 2023-06-03 DIAGNOSIS — Z5321 Procedure and treatment not carried out due to patient leaving prior to being seen by health care provider: Secondary | ICD-10-CM | POA: Diagnosis not present

## 2023-06-03 DIAGNOSIS — R042 Hemoptysis: Secondary | ICD-10-CM | POA: Diagnosis present

## 2023-06-03 LAB — BASIC METABOLIC PANEL
Anion gap: 13 (ref 5–15)
BUN: 6 mg/dL (ref 6–20)
CO2: 22 mmol/L (ref 22–32)
Calcium: 10 mg/dL (ref 8.9–10.3)
Chloride: 103 mmol/L (ref 98–111)
Creatinine, Ser: 0.85 mg/dL (ref 0.61–1.24)
GFR, Estimated: >60 mL/min (ref >60)
Glucose, Bld: 99 mg/dL (ref 70–99)
Potassium: 3.8 mmol/L (ref 3.5–5.1)
Sodium: 138 mmol/L (ref 135–145)

## 2023-06-03 LAB — CBC WITH DIFFERENTIAL/PLATELET
Abs Immature Granulocytes: 0.12 10*3/uL — ABNORMAL HIGH (ref 0.00–0.07)
Basophils Absolute: 0.1 10*3/uL (ref 0.0–0.1)
Basophils Relative: 1 %
Eosinophils Absolute: 0.1 10*3/uL (ref 0.0–0.5)
Eosinophils Relative: 1 %
HCT: 44.8 % (ref 39.0–52.0)
Hemoglobin: 15.8 g/dL (ref 13.0–17.0)
Immature Granulocytes: 1 %
Lymphocytes Relative: 13 %
Lymphs Abs: 1.8 10*3/uL (ref 0.7–4.0)
MCH: 30.4 pg (ref 26.0–34.0)
MCHC: 35.3 g/dL (ref 30.0–36.0)
MCV: 86.3 fL (ref 80.0–100.0)
Monocytes Absolute: 1 10*3/uL (ref 0.1–1.0)
Monocytes Relative: 7 %
Neutro Abs: 11.4 10*3/uL — ABNORMAL HIGH (ref 1.7–7.7)
Neutrophils Relative %: 77 %
Platelets: 313 10*3/uL (ref 150–400)
RBC: 5.19 MIL/uL (ref 4.22–5.81)
RDW: 12.2 % (ref 11.5–15.5)
WBC: 14.6 10*3/uL — ABNORMAL HIGH (ref 4.0–10.5)
nRBC: 0 % (ref 0.0–0.2)

## 2023-06-03 LAB — D-DIMER, QUANTITATIVE: D-Dimer, Quant: <0.27 ug{FEU}/mL (ref 0.00–0.50)

## 2023-06-03 NOTE — ED Provider Triage Note (Signed)
 Emergency Medicine Provider Triage Evaluation Note  Douglas Sanchez , a 23 y.o. male  was evaluated in triage.  Pt complains of bloody sputum.  Reports that he spit up some bloody sputum earlier today.  States is never happened before.  Denies associated sore throat.  Reports shortness of breath for the past week as well.  Denies chest pain or fevers.  Endorses history of GERD.  Review of Systems  Positive: Bloody sputum, shortness of breath Negative: CP  Physical Exam  There were no vitals taken for this visit. Gen:   Awake, no distress   Resp:  Normal effort  MSK:   Moves extremities without difficulty  Other:    Medical Decision Making  Medically screening exam initiated at 5:46 PM.  Appropriate orders placed.  Douglas Sanchez was informed that the remainder of the evaluation will be completed by another provider, this initial triage assessment does not replace that evaluation, and the importance of remaining in the ED until their evaluation is complete.   Maxwell Marion, PA-C 06/03/23 1750

## 2023-06-03 NOTE — ED Triage Notes (Signed)
 Pt reports he was clearing mucus out of his throat and notices mucus was blood tinged.  Denies pain.  Pt easily speaking full sentences.    Pt reports "I do this a lot" referring to having to clear mucus from his throat.

## 2023-06-04 NOTE — ED Notes (Signed)
 Pt not answering to call for vitals in lobby

## 2023-12-22 ENCOUNTER — Ambulatory Visit (HOSPITAL_COMMUNITY)
Admission: EM | Admit: 2023-12-22 | Discharge: 2023-12-22 | Disposition: A | Discharge status: Home / Self Care | Payer: MEDICAID | Attending: Emergency Medicine | Admitting: Emergency Medicine

## 2023-12-22 ENCOUNTER — Encounter (HOSPITAL_COMMUNITY): Payer: Self-pay

## 2023-12-22 ENCOUNTER — Ambulatory Visit (INDEPENDENT_AMBULATORY_CARE_PROVIDER_SITE_OTHER): Payer: MEDICAID

## 2023-12-22 ENCOUNTER — Ambulatory Visit (HOSPITAL_COMMUNITY): Payer: MEDICAID

## 2023-12-22 DIAGNOSIS — M7989 Other specified soft tissue disorders: Secondary | ICD-10-CM

## 2023-12-22 DIAGNOSIS — R2241 Localized swelling, mass and lump, right lower limb: Secondary | ICD-10-CM | POA: Diagnosis not present

## 2023-12-22 DIAGNOSIS — M79671 Pain in right foot: Secondary | ICD-10-CM | POA: Diagnosis not present

## 2023-12-22 MED ORDER — PREDNISONE 20 MG PO TABS: 40.0000 mg | ORAL_TABLET | Freq: Every day | ORAL | 0 refills | Status: AC

## 2023-12-22 NOTE — Discharge Instructions (Signed)
 X-ray is negative for any underlying fracture or dislocation. Start taking 2 tablets of prednisone once daily for 5 days to help relieve inflammation related to your pain. Alternate between 650 mg of Tylenol  and 400 to 600 mg of ibuprofen  every 6-8 hours as needed for pain. Wear the Ace wrap for comfort and to provide compression Rest, ice, and elevate your foot periodically throughout the day for additional relief of pain and swelling. Follow-up with [facility] for further evaluation and management of your foot pain if it continues. Otherwise follow-up with your primary care provider or return here as needed.
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
## 2023-12-22 NOTE — ED Triage Notes (Addendum)
 Patient here today with c/o right foot pain and swelling X 10 days. No known injury. Patient states that it started upon waking. Patient has been taking Oxycodone that he got from a friend with some relief.
